# Patient Record
Sex: Female | Born: 1935 | Race: White | Hispanic: No | Smoking: Never smoker
Health system: Southern US, Community
[De-identification: ages and names within clinical notes are randomized; demographics above are authoritative.]

## PROBLEM LIST (undated history)

## (undated) DIAGNOSIS — C439 Malignant melanoma of skin, unspecified: Secondary | ICD-10-CM

## (undated) DIAGNOSIS — Z955 Presence of coronary angioplasty implant and graft: Secondary | ICD-10-CM

## (undated) DIAGNOSIS — C801 Malignant (primary) neoplasm, unspecified: Secondary | ICD-10-CM

## (undated) DIAGNOSIS — I10 Essential (primary) hypertension: Secondary | ICD-10-CM

## (undated) DIAGNOSIS — H269 Unspecified cataract: Secondary | ICD-10-CM

## (undated) DIAGNOSIS — E785 Hyperlipidemia, unspecified: Secondary | ICD-10-CM

## (undated) DIAGNOSIS — I219 Acute myocardial infarction, unspecified: Secondary | ICD-10-CM

## (undated) DIAGNOSIS — N183 Chronic kidney disease, stage 3 unspecified: Secondary | ICD-10-CM

## (undated) DIAGNOSIS — M1990 Unspecified osteoarthritis, unspecified site: Secondary | ICD-10-CM

## (undated) HISTORY — DX: Malignant (primary) neoplasm, unspecified: C80.1

## (undated) HISTORY — PX: APPENDECTOMY: SHX54

## (undated) HISTORY — DX: Unspecified cataract: H26.9

## (undated) HISTORY — PX: PITUITARY SURGERY: SHX203

## (undated) HISTORY — PX: BREAST SURGERY: SHX581

## (undated) HISTORY — DX: Unspecified osteoarthritis, unspecified site: M19.90

## (undated) HISTORY — PX: ABDOMINAL HYSTERECTOMY: SHX81

## (undated) HISTORY — PX: CARDIAC CATHETERIZATION: SHX172

## (undated) HISTORY — DX: Acute myocardial infarction, unspecified: I21.9

## (undated) HISTORY — PX: EYE SURGERY: SHX253

---

## 1995-07-12 DIAGNOSIS — I219 Acute myocardial infarction, unspecified: Secondary | ICD-10-CM

## 1995-07-12 HISTORY — DX: Acute myocardial infarction, unspecified: I21.9

## 1997-10-23 ENCOUNTER — Ambulatory Visit (HOSPITAL_COMMUNITY): Admission: RE | Admit: 1997-10-23 | Discharge: 1997-10-23 | Payer: Self-pay | Admitting: Cardiology

## 1997-12-29 ENCOUNTER — Other Ambulatory Visit: Admission: RE | Admit: 1997-12-29 | Discharge: 1997-12-29 | Payer: Self-pay | Admitting: Internal Medicine

## 1998-10-09 ENCOUNTER — Emergency Department (HOSPITAL_COMMUNITY): Admission: EM | Admit: 1998-10-09 | Discharge: 1998-10-09 | Payer: Self-pay | Admitting: Emergency Medicine

## 1998-10-09 ENCOUNTER — Encounter: Payer: Self-pay | Admitting: Emergency Medicine

## 1999-04-19 ENCOUNTER — Inpatient Hospital Stay (HOSPITAL_COMMUNITY): Admission: RE | Admit: 1999-04-19 | Discharge: 1999-04-22 | Payer: Self-pay | Admitting: *Deleted

## 1999-04-19 ENCOUNTER — Encounter (INDEPENDENT_AMBULATORY_CARE_PROVIDER_SITE_OTHER): Payer: Self-pay | Admitting: *Deleted

## 1999-04-24 ENCOUNTER — Encounter: Payer: Self-pay | Admitting: Obstetrics and Gynecology

## 1999-04-24 ENCOUNTER — Inpatient Hospital Stay (HOSPITAL_COMMUNITY): Admission: AD | Admit: 1999-04-24 | Discharge: 1999-04-27 | Payer: Self-pay | Admitting: Obstetrics and Gynecology

## 1999-12-28 ENCOUNTER — Ambulatory Visit (HOSPITAL_COMMUNITY): Admission: RE | Admit: 1999-12-28 | Discharge: 1999-12-28 | Payer: Self-pay | Admitting: Orthopedic Surgery

## 1999-12-28 ENCOUNTER — Encounter: Payer: Self-pay | Admitting: Orthopedic Surgery

## 2000-05-10 ENCOUNTER — Other Ambulatory Visit: Admission: RE | Admit: 2000-05-10 | Discharge: 2000-05-10 | Payer: Self-pay | Admitting: *Deleted

## 2001-05-16 ENCOUNTER — Other Ambulatory Visit: Admission: RE | Admit: 2001-05-16 | Discharge: 2001-05-16 | Payer: Self-pay | Admitting: *Deleted

## 2001-10-24 ENCOUNTER — Emergency Department (HOSPITAL_COMMUNITY): Admission: EM | Admit: 2001-10-24 | Discharge: 2001-10-24 | Payer: Self-pay | Admitting: Emergency Medicine

## 2001-10-24 ENCOUNTER — Encounter: Payer: Self-pay | Admitting: Emergency Medicine

## 2001-11-02 ENCOUNTER — Encounter: Payer: Self-pay | Admitting: Internal Medicine

## 2001-11-02 ENCOUNTER — Ambulatory Visit (HOSPITAL_COMMUNITY): Admission: RE | Admit: 2001-11-02 | Discharge: 2001-11-02 | Payer: Self-pay | Admitting: Internal Medicine

## 2001-12-31 ENCOUNTER — Encounter: Payer: Self-pay | Admitting: Neurosurgery

## 2002-01-04 ENCOUNTER — Encounter (INDEPENDENT_AMBULATORY_CARE_PROVIDER_SITE_OTHER): Payer: Self-pay | Admitting: *Deleted

## 2002-01-04 ENCOUNTER — Inpatient Hospital Stay (HOSPITAL_COMMUNITY): Admission: RE | Admit: 2002-01-04 | Discharge: 2002-01-08 | Payer: Self-pay | Admitting: Neurosurgery

## 2002-03-12 ENCOUNTER — Encounter (INDEPENDENT_AMBULATORY_CARE_PROVIDER_SITE_OTHER): Payer: Self-pay | Admitting: Specialist

## 2002-03-12 ENCOUNTER — Ambulatory Visit (HOSPITAL_BASED_OUTPATIENT_CLINIC_OR_DEPARTMENT_OTHER): Admission: RE | Admit: 2002-03-12 | Discharge: 2002-03-12 | Payer: Self-pay | Admitting: Plastic Surgery

## 2002-08-19 ENCOUNTER — Ambulatory Visit (HOSPITAL_COMMUNITY): Admission: RE | Admit: 2002-08-19 | Discharge: 2002-08-19 | Payer: Self-pay | Admitting: Neurosurgery

## 2002-08-19 ENCOUNTER — Encounter: Payer: Self-pay | Admitting: Neurosurgery

## 2003-06-01 ENCOUNTER — Encounter: Admission: RE | Admit: 2003-06-01 | Discharge: 2003-06-01 | Payer: Self-pay | Admitting: Orthopedic Surgery

## 2003-06-13 ENCOUNTER — Ambulatory Visit (HOSPITAL_COMMUNITY): Admission: RE | Admit: 2003-06-13 | Discharge: 2003-06-13 | Payer: Self-pay | Admitting: Orthopedic Surgery

## 2004-03-28 ENCOUNTER — Emergency Department (HOSPITAL_COMMUNITY): Admission: EM | Admit: 2004-03-28 | Discharge: 2004-03-28 | Payer: Self-pay | Admitting: Emergency Medicine

## 2008-12-09 ENCOUNTER — Ambulatory Visit: Payer: Self-pay | Admitting: Oncology

## 2009-01-08 ENCOUNTER — Ambulatory Visit: Payer: Self-pay | Admitting: Oncology

## 2009-02-04 ENCOUNTER — Emergency Department (HOSPITAL_COMMUNITY): Admission: EM | Admit: 2009-02-04 | Discharge: 2009-02-04 | Payer: Self-pay | Admitting: Emergency Medicine

## 2010-11-26 NOTE — Discharge Summary (Signed)
   NAME:  Amber Bender, Amber Bender                          ACCOUNT NO.:  [id number]   MEDICAL RECORD NO.:  [id number]                   PATIENT TYPE:  NP   LOCATION:  3001                                 FACILITY:  [facility]   PHYSICIAN:  Kathaleen Maser. Pool, M.D.                 DATE OF BIRTH:  25-Nov-1920   DATE OF ADMISSION:  01/04/2002  DATE OF DISCHARGE:  01/08/2002                                 DISCHARGE SUMMARY   FINAL DIAGNOSIS:  Pituitary macroadenoma with optic chiasm compression.   OPERATIONS AND TREATMENTS:  Transphenoidal resection of pituitary  macroadenoma.   HISTORY OF PRESENT ILLNESS:  The patient is a 77 year old female who has  complaints of headache and is beginning to have some symptoms of visual  field loss.  Workup has demonstrated a large pituitary macroadenoma with  suprasellar extension causing optic chiasmatic compression.  Endocrine  workup has demonstrated that this is a non-functional macroadenoma.  The  patient presents now for elective resection of her tumor.   HOSPITAL COURSE:  The patient was taken to the operating room where an  uncomplicated transphenoidal resection of her macroadenoma was performed.  Postoperatively, the patient did well.  She had no evidence of  endocrinopathy.  Her visual field and acuity were intact bilaterally.  There  is no evidence of CSF leak.  She was gradually mobilized over time.  Dr.  Ezzard Standing removed her nasal packing.  At the time of discharge, she was doing  quite well without any complaints.   CONDITION ON DISCHARGE:  Improved.   DISCHARGE MEDICATIONS:  Prednisone 5 mg p.o. q.a.m. and 2.5 mg p.o. q.p.m.  She is to resume all home medications otherwise.  She is to follow up with  me in one week.  She is to follow up with Dr. Ezzard Standing in one week.  She is to  follow up with her primary medical doctor in two weeks for evaluation of her  pituitary cortisol axis.                                               Henry A. Pool, M.D.    HAP/MEDQ  D:  02/06/2002  T:  02/12/2002  Job:  [id number]

## 2010-11-26 NOTE — Op Note (Signed)
[place]. [facility]  Patient:    Amber Bender, Amber Bender Visit Number: [id number] MRN: [id number]          Service Type: SUR Location: 3100 [id number] Attending Physician:  Donn Pierini Dictated by:   Kristine Garbe Ezzard Standing, M.D. Proc. Date: 01/04/02 Admit Date:  01/04/2002   CC:         Julio Sicks, M.D.   Operative Report  PREOPERATIVE DIAGNOSIS:  Pituitary tumor.  POSTOPERATIVE DIAGNOSIS:  Pituitary tumor.  OPERATION: Transeptal transphenoidal resection of pituitary tumor.  SURGEONS:  Surgeon Kristine Garbe. Ezzard Standing, M.D., co-surgenon Julio Sicks, M.D.  ANESTHESIA: General endotracheal anesthesia.  COMPLICATIONS:  None.  INDICATIONS: The patient is a 77 year old female who has been having headaches. She underwent a CT scan and was found to have a pituitary tumor. She has had some mild vision changes and was taken to the operating room at this time for transphenoidal resection of pituitary tumor.  DESCRIPTION OF THE PROCEDURE:  The patient was brought to the operating room after undergoing general endotracheal anesthesia. The patient was positioned by Dr. Jordan Likes. The nose was then prepped by myself, Dr. Ezzard Standing, with 4% cocaine soaked pledgets in the septum, and the floor of the nose was injected with 1% Xylocaine with 1:100,000 epinephrine. The nose was then prepped with Betadine solution and draped out with sterile towels.  An extended hemitransfixion incision was made along the caudal edge of the septum on the right side. This was extended down to the floor of the nose. The mucoperiosteum was elevated off the floor of the nose and the mucoperichondroma was elevated off the right side of the septum and the septal cartilage and back to the periosteum of the septal bone on the right side.  On the left side the mucoperiosteum was elevated off the floor of the nose. The septum was then transected approximately 2 to 3 cm posteriorly. The anterior  cartilaginous septum was dislocated off of the maxillary crest into the left airway. A piece of cartilage was harvested for later closure.  The mucoperiosteum was then elevated on either side of the posterior septal bone down to the face of the sphenoid sinus. Using the small 4 mm osteotome, the sphenoid sinus was entered. Using rongeurs, the sphenoidotomy was enlarged to expose the whole sphenoid sinus and the sella. The sphenoid septum was taken down back to the face of the sella.  At this time Dr. Jordan Likes was scrubbed in and resected the pituitary tumor. After the resection of the pituitary tumor, I came back in to close the approach. The cartilaginous septum was brought back to midline on the maxillary crest. The mucoperichondrial flaps were brought back to midline. The hemitransfixion incision was closed with interrupted 5-0 chromic sutures and the septum with a 3-0 chromic suture. Silastic sheeting was placed on either side of the septum and secured with a 3-0 nylon suture and splints. The nose was then packed with 1/2 inch nonadherent gauze soaked in Bacitracin ointment and soaked with Bacitracin ointment along the floor of the nose bilaterally.  The throat pack was removed. The patient was awakened from anesthesia and transferred to the recovery room postoperatively doing well.  DISPOSITION:  The patient will be admitted to the neuro intensive care unit and we will plan on removing nasal packing in two to three days and plant subsequent discharge after packing removed.               Dictated by: Kristine Garbe Ezzard Standing, M.D.  Attending Physician:  Donn Pierini DD:  01/04/02 TD:  01/06/02 Job: [id number]

## 2010-11-26 NOTE — Op Note (Signed)
[place]. [facility]  Patient:    Amber Bender, Amber Bender Visit Number: [id number] MRN: [id number]          Service Type: SUR Location: 3100 [id number] Attending Physician:  Donn Pierini Dictated by:   Julio Sicks, M.D. Proc. Date: 01/04/02 Admit Date:  01/04/2002                             Operative Report  PREOPERATIVE DIAGNOSIS:   Pituitary macroadenoma with optic chiasm compression.  POSTOPERATIVE DIAGNOSIS:  Pituitary macroadenoma with optic chiasm compression.  PROCEDURES PERFORMED:  Transphenoidal resection of pituitary macroadenoma.  SURGEON:  Julio Sicks, M.D.  ASSISTANT SURGEONS:  Reinaldo Meeker, M.D., ENT surgeon Kristine Garbe. Ezzard Standing, M.D.  ANESTHESIA: General endotracheal anesthesia.  INDICATIONS:  The patient is a 77 year old female who has a history of some retro-orbital pain and visual field loss. A workup has demonstrated a nonfunctional pituitary macroadenoma with chiasmatic compression. The patient has been counselled as to her options. She has decided to proceed with a transphenoidal resection of her tumor. She is aware of the risks and benefits and wishes to proceed.  OPERATIVE NOTE:  The patient was taken to the operating room and placed in the supine position on the operating table. After an adequate level of anesthesia was achieved, the patient was positioned supine with her head placed in a [place] horseshoe. The patients nose was then prepped and draped sterilely by Dr. Ezzard Standing.  Dr. Ezzard Standing then made an approach into the sphenoid sinus. The sphenoid sinus mucosa was stripped. A microscope was brought into the field. The tumor had eroded through the floor of the sella turcica. I then widened the opening of the sellar floor, exposing the dura overlying the tumor. This was then coagulated using a bipolar electrocautery.  The dura was then incised in an H-incision. The dural leaflets were then coagulated back to the side.  The tumor itself was soft and necrotic with some cystic elements. A micropituitary was used to collect samples and these were sent to pathology. This was consistent with a microadenoma.  Using cautery curettes the sella was then curettaged of all tumor. The diaphragm sella began to herniate downward. There was a good pulsatile compression of the diaphragm. There was no evidence of any further tumor. The normal pituitary was visible posteriorly.  After inspection of the sella one final time, there once again was no evidence of any residual tumor. The wound was irrigated then with antibiotic solution. Tisseel fibrin glue sealant was then placed over the dural opening. Dr. Ezzard Standing then returned, closed the nose in a typical fashion.  There were no complications with the procedure. The patient tolerated the procedure well and she returned to the recovery room postoperatively. Dictated by:   Julio Sicks, M.D. Attending Physician:  Donn Pierini DD:  01/04/02 TD:  01/06/02 Job: [id number]

## 2010-11-26 NOTE — Op Note (Signed)
NAME:  VARONICA, SIHARATH                          ACCOUNT NO.:  [id number]   MEDICAL RECORD NO.:  [id number]                   PATIENT TYPE:  AMB   LOCATION:  DAY                                  FACILITY:  [facility]   PHYSICIAN:  Ronald A. Gioffre, M.D.             DATE OF BIRTH:  [birth year]   DATE OF PROCEDURE:  06/13/2003  DATE OF DISCHARGE:                                 OPERATIVE REPORT   PREOPERATIVE DIAGNOSES:  1. Torn medial and torn lateral menisci, left knee.  2. Degenerative arthritis, left knee.   POSTOPERATIVE DIAGNOSES:  1. Torn medial and torn lateral menisci, left knee.  2. Degenerative arthritis, left knee.   OPERATION:  1. Diagnostic arthroscopy, left knee.  2. Medial meniscectomy, left knee.  3. Lateral meniscectomy, left knee.   SURGEON:  Georges Lynch. Darrelyn Hillock, M.D.   ASSESSMENT:  Nurse.   DESCRIPTION OF PROCEDURE:  Under general anesthesia, routine orthopedic prep  and draping of the left lower extremity was carried out.  She had 1 g of IV  Ancef.  A small punctate incision made in the suprapatellar pouch and the  inflow cannula was inserted, and the knee was distended with saline.  At  this time another small punctate incision made in the anterior lateral  joint, the arthroscope was entered, and a complete diagnostic arthroscopy is  carried out.  I noted that she had medial and lateral meniscal tears.  I  introduced the shaver suction device, did medial and lateral meniscectomy.  The patient's main arthritic problem was over in the medial joint space.  She had some changes laterally as well.  There were no other gross  abnormalities about the knee.  I thoroughly irrigated out the knee, removed  all the fluid, sutured all three wounds with 3-0 nylon suture, and injected  30 mL of 0.5% Marcaine with epinephrine in the knee joint and applied  sterile Neosporin bundle dressing.   Postop she will be on [facility] for pain one every four hours p.r.n.  She will be  on Bufferin one twice a day as an anticoagulant.  She will be on  the walker, partial weightbearing to full weightbearing, and I will see her  in two weeks or prior to that.                                               Ronald A. Darrelyn Hillock, M.D.    RAG/MEDQ  D:  06/13/2003  T:  06/13/2003  Job:  [id number]

## 2010-12-22 ENCOUNTER — Ambulatory Visit: Payer: Self-pay | Admitting: Ophthalmology

## 2011-01-04 ENCOUNTER — Ambulatory Visit: Payer: Self-pay | Admitting: Ophthalmology

## 2011-11-02 ENCOUNTER — Ambulatory Visit (INDEPENDENT_AMBULATORY_CARE_PROVIDER_SITE_OTHER): Payer: MEDICARE | Admitting: Family Medicine

## 2011-11-02 DIAGNOSIS — J301 Allergic rhinitis due to pollen: Secondary | ICD-10-CM

## 2011-11-02 DIAGNOSIS — R05 Cough: Secondary | ICD-10-CM

## 2011-11-02 DIAGNOSIS — R059 Cough, unspecified: Secondary | ICD-10-CM

## 2011-11-02 MED ORDER — BENZONATATE 100 MG PO CAPS: 100.0000 mg | ORAL_CAPSULE | Freq: Three times a day (TID) | ORAL | Status: AC | PRN

## 2011-11-02 MED ORDER — AZELASTINE HCL 0.1 % NA SOLN: 1.0000 | Freq: Two times a day (BID) | NASAL | Status: DC

## 2011-11-02 NOTE — Patient Instructions (Signed)
Try nasal spray and cough medicine as prescribed.  If not improving in next few days, return to clinic or follow up with your primary care provider. Return to the clinic or go to the nearest emergency room if any of your symptoms worsen or new symptoms occur. Allergic Rhinitis Allergic rhinitis is when the mucous membranes in the nose respond to allergens. Allergens are particles in the air that cause your body to have an allergic reaction. This causes you to release allergic antibodies. Through a chain of events, these eventually cause you to release histamine into the blood stream (hence the use of antihistamines). Although meant to be protective to the body, it is this release that causes your discomfort, such as frequent sneezing, congestion and an itchy runny nose.  CAUSES  The pollen allergens may come from grasses, trees, and weeds. This is seasonal allergic rhinitis, or "hay fever." Other allergens cause year-round allergic rhinitis (perennial allergic rhinitis) such as house dust mite allergen, pet dander and mold spores.  SYMPTOMS   Nasal stuffiness (congestion).   Runny, itchy nose with sneezing and tearing of the eyes.   There is often an itching of the mouth, eyes and ears.  It cannot be cured, but it can be controlled with medications. DIAGNOSIS  If you are unable to determine the offending allergen, skin or blood testing may find it. TREATMENT   Avoid the allergen.   Medications and allergy shots (immunotherapy) can help.   Hay fever may often be treated with antihistamines in pill or nasal spray forms. Antihistamines block the effects of histamine. There are over-the-counter medicines that may help with nasal congestion and swelling around the eyes. Check with your caregiver before taking or giving this medicine.  If the treatment above does not work, there are many new medications your caregiver can prescribe. Stronger medications may be used if initial measures are  ineffective. Desensitizing injections can be used if medications and avoidance fails. Desensitization is when a patient is given ongoing shots until the body becomes less sensitive to the allergen. Make sure you follow up with your caregiver if problems continue. SEEK MEDICAL CARE IF:   You develop fever (more than 100.5 F (38.1 C).   You develop a cough that does not stop easily (persistent).   You have shortness of breath.   You start wheezing.   Symptoms interfere with normal daily activities.  Document Released: 03/22/2001 Document Revised: 06/16/2011 Document Reviewed: 10/01/2008 [facility] Patient Information 2012 [place], [place].  Cough, Adult  A cough is a reflex that helps clear your throat and airways. It can help heal the body or may be a reaction to an irritated airway. A cough may only last 2 or 3 weeks (acute) or may last more than 8 weeks (chronic).  CAUSES Acute cough:  Viral or bacterial infections.  Chronic cough:  Infections.   Allergies.   Asthma.   Post-nasal drip.   Smoking.   Heartburn or acid reflux.   Some medicines.   Chronic lung problems (COPD).   Cancer.  SYMPTOMS   Cough.   Fever.   Chest pain.   Increased breathing rate.   High-pitched whistling sound when breathing (wheezing).   Colored mucus that you cough up (sputum).  TREATMENT   A bacterial cough may be treated with antibiotic medicine.   A viral cough must run its course and will not respond to antibiotics.   Your caregiver may recommend other treatments if you have a chronic cough.  HOME  CARE INSTRUCTIONS   Only take over-the-counter or prescription medicines for pain, discomfort, or fever as directed by your caregiver. Use cough suppressants only as directed by your caregiver.   Use a cold steam vaporizer or humidifier in your bedroom or home to help loosen secretions.   Sleep in a semi-upright position if your cough is worse at night.   Rest as needed.    Stop smoking if you smoke.  SEEK IMMEDIATE MEDICAL CARE IF:   You have pus in your sputum.   Your cough starts to worsen.   You cannot control your cough with suppressants and are losing sleep.   You begin coughing up blood.   You have difficulty breathing.   You develop pain which is getting worse or is uncontrolled with medicine.   You have a fever.  MAKE SURE YOU:   Understand these instructions.   Will watch your condition.   Will get help right away if you are not doing well or get worse.  Document Released: 12/24/2010 Document Revised: 06/16/2011 Document Reviewed: 12/24/2010 [facility] Patient Information 2012 [place], [place].

## 2011-11-02 NOTE — Progress Notes (Signed)
  Subjective:    Patient ID: Amber Bender, female    DOB: 01/27/1921, 78 y.o.   MRN: [id number]  Amber Bender is a 78 y.o. female Hx of pituitary tumor s/p removal in 2003 , had sinus bone removal,  Has chronic drainage in sinuses at time.  Clear discharge at times.  More congestion this past week and drainage down throat.  Sore throat, sneezing.  Now with PND - cough.  No shortness of breath, no fever.  Clear nasal discharge.  Using saline NS. Allergy meds in past makes heart race - not sure of name of particular medicine that did this. Has stent., but no known arrhythmia. Tried childrens benadryl x 1 - made tired, but no heart racing.  Cardiologist: [place] - called their office - last eval in 2005, no known hx of arrhythmia. primary provider: Jacky Kindle.   Review of Systems  Constitutional: Negative for fever and chills.  HENT: Positive for congestion, rhinorrhea, sneezing, voice change and postnasal drip. Negative for trouble swallowing.   Respiratory: Positive for cough. Negative for chest tightness and shortness of breath.   Cardiovascular: Negative for chest pain.       Objective:   Physical Exam  Constitutional: She is oriented to person, place, and time. She appears well-developed and well-nourished.  HENT:  Head: Normocephalic.  Right Ear: External ear normal.  Mouth/Throat: Oropharynx is clear and moist.  Neck: Normal range of motion.  Cardiovascular: Normal rate, regular rhythm, normal heart sounds and intact distal pulses.   Pulmonary/Chest: Effort normal and breath sounds normal. No respiratory distress. She has no wheezes.  Abdominal: Soft.  Neurological: She is alert and oriented to person, place, and time.  Skin: Skin is warm and dry.  Psychiatric: She has a normal mood and affect. Her behavior is normal.       Assessment & Plan:  Amber Bender is a 78 y.o. female Cough with PND, suspected allergic rhinitis.  Lungs clear on exam.  Vitals reassuring. Trial  of astelin ns 1-2 spr/nost bid prn, cont slaine nasal spray, and tessalon perles tid prn.  If not improving in few days, or cough worsens sooner, rtc.

## 2012-02-16 ENCOUNTER — Other Ambulatory Visit: Payer: Self-pay | Admitting: Dermatology

## 2012-04-02 ENCOUNTER — Ambulatory Visit: Payer: MEDICARE

## 2012-04-02 ENCOUNTER — Ambulatory Visit (INDEPENDENT_AMBULATORY_CARE_PROVIDER_SITE_OTHER): Payer: MEDICARE | Admitting: Family Medicine

## 2012-04-02 VITALS — BP 134/74 | HR 70 | Temp 98.3°F | Resp 16 | Ht 60.0 in | Wt 147.0 lb

## 2012-04-02 DIAGNOSIS — R0781 Pleurodynia: Secondary | ICD-10-CM

## 2012-04-02 DIAGNOSIS — R079 Chest pain, unspecified: Secondary | ICD-10-CM

## 2012-04-02 DIAGNOSIS — R109 Unspecified abdominal pain: Secondary | ICD-10-CM

## 2012-04-02 DIAGNOSIS — R8281 Pyuria: Secondary | ICD-10-CM

## 2012-04-02 LAB — POCT URINALYSIS DIPSTICK
Bilirubin, UA: NEGATIVE
Blood, UA: NEGATIVE
Glucose, UA: NEGATIVE
Ketones, UA: NEGATIVE
Nitrite, UA: NEGATIVE
Protein, UA: NEGATIVE
Spec Grav, UA: 1.01
Urobilinogen, UA: 0.2
pH, UA: 7

## 2012-04-02 LAB — POCT UA - MICROSCOPIC ONLY
Bacteria, U Microscopic: NEGATIVE
Casts, Ur, LPF, POC: NEGATIVE
Crystals, Ur, HPF, POC: NEGATIVE
Mucus, UA: NEGATIVE
Yeast, UA: NEGATIVE

## 2012-04-02 MED ORDER — NITROFURANTOIN MONOHYD MACRO 100 MG PO CAPS: 100.0000 mg | ORAL_CAPSULE | Freq: Two times a day (BID) | ORAL | Status: DC

## 2012-04-02 MED ORDER — TRAMADOL HCL 50 MG PO TABS: 50.0000 mg | ORAL_TABLET | Freq: Four times a day (QID) | ORAL | Status: DC | PRN

## 2012-04-02 MED ORDER — VALACYCLOVIR HCL 1 G PO TABS: 1000.0000 mg | ORAL_TABLET | Freq: Three times a day (TID) | ORAL | Status: DC

## 2012-04-02 NOTE — Progress Notes (Signed)
  Subjective:    Patient ID: Amber Bender, female    DOB: Dec 19, 1920, 78 y.o.   MRN: [id number]  HPI Amber Bender is a 78 y.o. female Started with R sided rib pain - noticed 2 days ago.  No known injury.  No similar pain in past.  Hurts to take deep breath, but not short of breath. No recent prolonged car travel or air travel, no recent calf pain or swelling. Nonsmoker.  Hx of shingles in face about 3 years ago, and then zostavax 6 months later.   Tx: ibuprofen 1-2 x per day for few days, 1 alleve last night.  Noted soreness with turning only overnight.   No known hx of broken ribs or vertebral fx.  Hx of osteopenia.   No anterior chest pain or palpitations.    Review of Systems  Genitourinary: Positive for dysuria (possible slight burning). Negative for urgency, frequency and hematuria.  Musculoskeletal: Positive for back pain.  Skin: Negative for rash.       Objective:   Physical Exam  Constitutional: She is oriented to person, place, and time. She appears well-developed and well-nourished.  HENT:  Head: Normocephalic and atraumatic.  Cardiovascular: Normal rate, regular rhythm, normal heart sounds and intact distal pulses.   Pulmonary/Chest: Effort normal and breath sounds normal.   She exhibits tenderness.  Abdominal: Soft. Normal appearance. There is no tenderness.  Musculoskeletal: Normal range of motion.  Neurological: She is alert and oriented to person, place, and time.  Skin: Skin is warm and dry. No rash noted.   Results for orders placed in visit on 04/02/12  POCT URINALYSIS DIPSTICK      Component Value Range   Color, UA yellow     Clarity, UA clear     Glucose, UA neg     Bilirubin, UA neg     Ketones, UA neg     Spec Grav, UA 1.010     Blood, UA neg     pH, UA 7.0     Protein, UA neg     Urobilinogen, UA 0.2     Nitrite, UA neg     Leukocytes, UA moderate (2+)    POCT UA - MICROSCOPIC ONLY      Component Value Range   WBC, Ur, HPF, POC 5-10     RBC, urine, microscopic 0-2     Bacteria, U Microscopic neg     Mucus, UA neg     Epithelial cells, urine per micros 0-1     Crystals, Ur, HPF, POC neg     Casts, Ur, LPF, POC neg     Yeast, UA neg       [facility] reading (PRIMARY) by  Dr. Neva Seat: Scoliosis, no apparent rib fx.    Assessment & Plan:  Amber Bender is a 78 y.o. female 1. Flank pain  POCT urinalysis dipstick, POCT UA - Microscopic Only  2. Rib pain on right side  DG Ribs Unilateral W/Chest Right  3. Pyuria  POCT UA - Microscopic Only   Flank/back pain - appears msk in nature but ddx includes zoster with bandlike nature.  Slight pyuria - UTI with early pyelo in ddx, but less likely with no fever and minimal sx's.  Check urine culture.  Start macrobid 100mg  BID #14. Valtrex 1 gram tid #21. Heat or ice to area, tylenol otc.  If needed for more intense pain - ultram 50mg  Q6hprn.  rtc precautions.

## 2012-04-02 NOTE — Patient Instructions (Signed)
Your pain is likely from the muscles in between your ribs, but early shingles is possible, an there is a possible urinary tract infection. Start the antibiotic as discussed, tylenol over the counter, or if needed for more sever pain - tramadol as prescribed.  You can also start the valtrex in case this is early shingles.  Your should receive a call or letter about your lab results within the next week to 10 days.  Return to the clinic or go to the nearest emergency room if any of your symptoms worsen or new symptoms occur.

## 2012-04-04 LAB — URINE CULTURE: Colony Count: 9000

## 2012-09-18 ENCOUNTER — Other Ambulatory Visit: Payer: Self-pay | Admitting: Family Medicine

## 2012-09-18 ENCOUNTER — Ambulatory Visit
Admission: RE | Admit: 2012-09-18 | Discharge: 2012-09-18 | Disposition: A | Discharge status: Home / Self Care | Payer: Medicare Other | Source: Ambulatory Visit | Attending: Family Medicine | Admitting: Family Medicine

## 2012-10-08 ENCOUNTER — Other Ambulatory Visit: Payer: Self-pay | Admitting: Internal Medicine

## 2012-10-08 DIAGNOSIS — R109 Unspecified abdominal pain: Secondary | ICD-10-CM

## 2012-10-08 DIAGNOSIS — R110 Nausea: Secondary | ICD-10-CM

## 2013-01-27 ENCOUNTER — Ambulatory Visit: Payer: 59

## 2013-01-27 ENCOUNTER — Ambulatory Visit (INDEPENDENT_AMBULATORY_CARE_PROVIDER_SITE_OTHER): Payer: 59 | Admitting: Family Medicine

## 2013-01-27 VITALS — BP 112/62 | HR 77 | Temp 98.0°F | Resp 16 | Ht 60.5 in | Wt 152.8 lb

## 2013-01-27 DIAGNOSIS — M25559 Pain in unspecified hip: Secondary | ICD-10-CM

## 2013-01-27 DIAGNOSIS — T148XXA Other injury of unspecified body region, initial encounter: Secondary | ICD-10-CM

## 2013-01-27 DIAGNOSIS — M25552 Pain in left hip: Secondary | ICD-10-CM

## 2013-01-27 NOTE — Progress Notes (Signed)
[facility]:  Office Visit  Chief Complaint:  Chief Complaint  Patient presents with  . hip/groin pain    right side since Thursday    HPI: Amber Bender is a 79 y.o. female who complains of  Left sided groin and hip pain x4 days. NKI , sore with walking. Has osteopenia. She was on fosmax for 10 years. She has arthritis in both knees. She goes to [employer]	[place], [employer], and may have injured it there but not sure. They do stretches and things but it is very low impact.   She deneis any injuries/surgeries. She has had problems with arthritis in her hips before.  Denies numbness, weakness, tingling or bowel or bladder incontinence that is new.   Past Medical History  Diagnosis Date  . Cancer   . Cataract   . Myocardial infarction    Past Surgical History  Procedure Laterality Date  . Appendectomy    . Breast surgery    . Eye surgery    . Abdominal hysterectomy     History   Social History  . Marital Status: Widowed    Spouse Name: N/A    Number of Children: N/A  . Years of Education: N/A   Social History Main Topics  . Smoking status: Never Smoker   . Smokeless tobacco: None  . Alcohol Use: Yes  . Drug Use: No  . Sexually Active: No     Comment: widow   Other Topics Concern  . None   Social History Narrative  . None   Family History  Problem Relation Age of Onset  . Cancer Mother    Allergies  Allergen Reactions  . Erythromycin Other (See Comments)    Stomach cramps  . Niacin And Related Hives  . Ciprofloxacin Rash  . Sulfa Antibiotics Rash   Prior to Admission medications   Medication Sig Start Date End Date Taking? Authorizing Provider  aspirin 81 MG tablet Take 81 mg by mouth daily.   Yes Historical Provider, MD  calcium-vitamin D (OSCAL WITH D) 250-125 MG-UNIT per tablet Take 1 tablet by mouth daily.   Yes Historical Provider, MD  glucosamine-chondroitin 500-400 MG tablet Take 1 tablet by mouth 3 (three) times daily.   Yes  Historical Provider, MD  lisinopril-hydrochlorothiazide (PRINZIDE,ZESTORETIC) 20-12.5 MG per tablet Take 1 tablet by mouth daily.   Yes Historical Provider, MD  simvastatin (ZOCOR) 20 MG tablet Take 20 mg by mouth every evening.   Yes Historical Provider, MD  azelastine (ASTELIN) 137 MCG/SPRAY nasal spray Place 1 spray into the nose 2 (two) times daily. Use in each nostril as directed 11/02/11 11/01/12  Shade Flood, MD  nitrofurantoin, macrocrystal-monohydrate, (MACROBID) 100 MG capsule Take 1 capsule (100 mg total) by mouth 2 (two) times daily. 04/02/12   Shade Flood, MD  traMADol (ULTRAM) 50 MG tablet Take 1 tablet (50 mg total) by mouth every 6 (six) hours as needed for pain. 04/02/12   Shade Flood, MD  valACYclovir (VALTREX) 1000 MG tablet Take 1 tablet (1,000 mg total) by mouth 3 (three) times daily. 04/02/12   Shade Flood, MD     ROS: The patient denies fevers, chills, night sweats, unintentional weight loss, chest pain, palpitations, wheezing, dyspnea on exertion, nausea, vomiting, abdominal pain, dysuria, hematuria, melena, numbness, weakness, or tingling.   All other systems have been reviewed and were otherwise negative with the exception of those mentioned in the HPI and as above.    PHYSICAL  EXAMCeasar Mons Vitals:   01/27/13 1444  BP: 112/62  Pulse: 77  Temp: 98 F (36.7 C)  Resp: 16   Filed Vitals:   01/27/13 1444  Height: 5' 0.5" (1.537 m)  Weight: 152 lb 12.8 oz (69.31 kg)   Body mass index is 29.34 kg/(m^2).  General: Alert, no acute distress HEENT:  Normocephalic, atraumatic, oropharynx patent.  Cardiovascular:  Regular rate and rhythm, no rubs murmurs or gallops.  No Carotid bruits, radial pulse intact. No pedal edema.  Respiratory: Clear to auscultation bilaterally.  No wheezes, rales, or rhonchi.  No cyanosis, no use of accessory musculature GI: No organomegaly, abdomen is soft and non-tender, positive bowel sounds.  No masses. Skin: No  rashes. Neurologic: Facial musculature symmetric. Psychiatric: Patient is appropriate throughout our interaction. Lymphatic: No cervical lymphadenopathy Musculoskeletal: Gait intact. + tenderness on left iliac crest on palpation Full ROm 5/5 strength, she was able to do add/abd, IR/ER of hip without problesm No lumabar spine tenderness, no knee tenderness Sensation intact No saddle anesthesia   LABS: Results for orders placed in visit on 04/02/12  URINE CULTURE      Result Value Range   Colony Count 9,000 COLONIES/ML     Organism ID, Bacteria Insignificant Growth    POCT URINALYSIS DIPSTICK      Result Value Range   Color, UA yellow     Clarity, UA clear     Glucose, UA neg     Bilirubin, UA neg     Ketones, UA neg     Spec Grav, UA 1.010     Blood, UA neg     pH, UA 7.0     Protein, UA neg     Urobilinogen, UA 0.2     Nitrite, UA neg     Leukocytes, UA moderate (2+)    POCT UA - MICROSCOPIC ONLY      Result Value Range   WBC, Ur, HPF, POC 5-10     RBC, urine, microscopic 0-2     Bacteria, U Microscopic neg     Mucus, UA neg     Epithelial cells, urine per micros 0-1     Crystals, Ur, HPF, POC neg     Casts, Ur, LPF, POC neg     Yeast, UA neg       EKG/XRAY:   Primary read interpreted by Dr. Conley Rolls at [facility]. + scoliosis + DJD No fx/dislocation    ASSESSMENT/PLAN: Encounter Diagnoses  Name Primary?  . Hip pain, acute, left Yes  . Sprain and strain    Continue with NSAIDs with food prn, monitor for ulcers and GIB C/w ROM exercises and water aerobics F/u prn    Darvis Croft PHUONG, DO 01/27/2013 3:56 PM

## 2013-11-29 ENCOUNTER — Ambulatory Visit: Payer: 59

## 2013-11-29 ENCOUNTER — Ambulatory Visit (INDEPENDENT_AMBULATORY_CARE_PROVIDER_SITE_OTHER): Payer: 59 | Admitting: Family Medicine

## 2013-11-29 VITALS — BP 132/70 | HR 73 | Temp 98.2°F | Resp 18 | Ht 59.5 in | Wt 151.6 lb

## 2013-11-29 DIAGNOSIS — R059 Cough, unspecified: Secondary | ICD-10-CM

## 2013-11-29 DIAGNOSIS — M419 Scoliosis, unspecified: Secondary | ICD-10-CM

## 2013-11-29 DIAGNOSIS — R05 Cough: Secondary | ICD-10-CM

## 2013-11-29 DIAGNOSIS — J209 Acute bronchitis, unspecified: Secondary | ICD-10-CM

## 2013-11-29 MED ORDER — AMOXICILLIN-POT CLAVULANATE 875-125 MG PO TABS: 1.0000 | ORAL_TABLET | Freq: Two times a day (BID) | ORAL | Status: DC

## 2013-11-29 MED ORDER — PREDNISONE 20 MG PO TABS: ORAL_TABLET | ORAL | Status: DC

## 2013-11-29 NOTE — Patient Instructions (Signed)

## 2013-11-29 NOTE — Progress Notes (Signed)
Is a 80 year old woman who comes in with a three-day history of cough. It began as a sore throat several days ago and then has progressed to a deep throaty cough associated with left ear pain. She's had no fever. She's had no shortness of breath or chest pain.  Nevertheless she's been wheezing and coughing all night which has interfered with her sleep.  Objective: HEENT unremarkable with exception of moderately erythematous posterior pharynx. The TMs are normal. Neck: Supple no adenopathy Chest: Bilateral wheezing and basilar rales. Heart: Regular no murmur Extremities: No edema Neuro: Alert, articulate, stable gait  [facility] reading (PRIMARY) by  Dr. Joseph Art: CXR. Marked thoracic scoliosis, no infiltrates, heavy bronchial markings  Assessment: Acute bronchitis, no respiratory compromise  Plan: Antibiotics Acute bronchitis - Plan: amoxicillin-clavulanate (AUGMENTIN) 875-125 MG per tablet, predniSONE (DELTASONE) 20 MG tablet  Cough - Plan: DG Chest 2 View, amoxicillin-clavulanate (AUGMENTIN) 875-125 MG per tablet, predniSONE (DELTASONE) 20 MG tablet  Thoracic scoliosis  Signed, Robyn Haber, MD

## 2014-02-28 ENCOUNTER — Ambulatory Visit (INDEPENDENT_AMBULATORY_CARE_PROVIDER_SITE_OTHER): Payer: 59

## 2014-02-28 ENCOUNTER — Ambulatory Visit (INDEPENDENT_AMBULATORY_CARE_PROVIDER_SITE_OTHER): Payer: 59 | Admitting: Internal Medicine

## 2014-02-28 VITALS — BP 144/70 | HR 95 | Temp 99.2°F | Resp 18

## 2014-02-28 DIAGNOSIS — M79641 Pain in right hand: Secondary | ICD-10-CM

## 2014-02-28 DIAGNOSIS — S20219A Contusion of unspecified front wall of thorax, initial encounter: Secondary | ICD-10-CM

## 2014-02-28 DIAGNOSIS — M79609 Pain in unspecified limb: Secondary | ICD-10-CM

## 2014-02-28 DIAGNOSIS — R079 Chest pain, unspecified: Secondary | ICD-10-CM

## 2014-02-28 DIAGNOSIS — R000 Tachycardia, unspecified: Secondary | ICD-10-CM

## 2014-02-28 MED ORDER — MELOXICAM 15 MG PO TABS: 15.0000 mg | ORAL_TABLET | Freq: Every day | ORAL | Status: DC
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
## 2014-05-30 ENCOUNTER — Emergency Department (HOSPITAL_COMMUNITY)
Admission: EM | Admit: 2014-05-30 | Discharge: 2014-05-30 | Disposition: A | Discharge status: Home / Self Care | Payer: Medicare Other | Attending: Emergency Medicine | Admitting: Emergency Medicine

## 2014-05-30 ENCOUNTER — Emergency Department (HOSPITAL_COMMUNITY): Payer: Medicare Other

## 2014-05-30 ENCOUNTER — Ambulatory Visit (INDEPENDENT_AMBULATORY_CARE_PROVIDER_SITE_OTHER): Payer: 59 | Admitting: Family Medicine

## 2014-05-30 ENCOUNTER — Encounter (HOSPITAL_COMMUNITY): Payer: Self-pay | Admitting: Emergency Medicine

## 2014-05-30 VITALS — BP 146/90 | HR 68 | Temp 97.3°F | Resp 18

## 2014-05-30 DIAGNOSIS — I252 Old myocardial infarction: Secondary | ICD-10-CM | POA: Insufficient documentation

## 2014-05-30 DIAGNOSIS — Z7982 Long term (current) use of aspirin: Secondary | ICD-10-CM | POA: Insufficient documentation

## 2014-05-30 DIAGNOSIS — R0602 Shortness of breath: Secondary | ICD-10-CM

## 2014-05-30 DIAGNOSIS — R112 Nausea with vomiting, unspecified: Secondary | ICD-10-CM | POA: Diagnosis not present

## 2014-05-30 DIAGNOSIS — R079 Chest pain, unspecified: Secondary | ICD-10-CM

## 2014-05-30 DIAGNOSIS — Z79899 Other long term (current) drug therapy: Secondary | ICD-10-CM | POA: Insufficient documentation

## 2014-05-30 DIAGNOSIS — H81399 Other peripheral vertigo, unspecified ear: Secondary | ICD-10-CM | POA: Insufficient documentation

## 2014-05-30 DIAGNOSIS — R1013 Epigastric pain: Secondary | ICD-10-CM | POA: Diagnosis not present

## 2014-05-30 DIAGNOSIS — K2970 Gastritis, unspecified, without bleeding: Secondary | ICD-10-CM | POA: Diagnosis not present

## 2014-05-30 DIAGNOSIS — R42 Dizziness and giddiness: Secondary | ICD-10-CM

## 2014-05-30 HISTORY — DX: Malignant melanoma of skin, unspecified: C43.9

## 2014-05-30 LAB — CBC WITH DIFFERENTIAL/PLATELET
BASOS ABS: 0.1 10*3/uL (ref 0.0–0.1)
Basophils Relative: 1 % (ref 0–1)
EOS PCT: 1 % (ref 0–5)
Eosinophils Absolute: 0.1 10*3/uL (ref 0.0–0.7)
HCT: 38.9 % (ref 36.0–46.0)
Hemoglobin: 13 g/dL (ref 12.0–15.0)
LYMPHS PCT: 24 % (ref 12–46)
Lymphs Abs: 1.8 10*3/uL (ref 0.7–4.0)
MCH: 29.6 pg (ref 26.0–34.0)
MCHC: 33.4 g/dL (ref 30.0–36.0)
MCV: 88.6 fL (ref 78.0–100.0)
Monocytes Absolute: 0.3 10*3/uL (ref 0.1–1.0)
Monocytes Relative: 4 % (ref 3–12)
NEUTROS ABS: 5.4 10*3/uL (ref 1.7–7.7)
Neutrophils Relative %: 70 % (ref 43–77)
PLATELETS: 239 10*3/uL (ref 150–400)
RBC: 4.39 MIL/uL (ref 3.87–5.11)
RDW: 13.6 % (ref 11.5–15.5)
WBC: 7.6 10*3/uL (ref 4.0–10.5)

## 2014-05-30 LAB — LIPASE, BLOOD: Lipase: 37 U/L (ref 11–59)

## 2014-05-30 LAB — URINALYSIS, ROUTINE W REFLEX MICROSCOPIC
Bilirubin Urine: NEGATIVE
Glucose, UA: NEGATIVE mg/dL
Hgb urine dipstick: NEGATIVE
Ketones, ur: NEGATIVE mg/dL
Leukocytes, UA: NEGATIVE
Nitrite: NEGATIVE
Protein, ur: NEGATIVE mg/dL
Specific Gravity, Urine: 1.01 (ref 1.005–1.030)
Urobilinogen, UA: 0.2 mg/dL (ref 0.0–1.0)
pH: 6.5 (ref 5.0–8.0)

## 2014-05-30 LAB — COMPREHENSIVE METABOLIC PANEL
ALT: 14 U/L (ref 0–35)
AST: 17 U/L (ref 0–37)
Albumin: 3.6 g/dL (ref 3.5–5.2)
Alkaline Phosphatase: 82 U/L (ref 39–117)
Anion gap: 12 (ref 5–15)
BUN: 20 mg/dL (ref 6–23)
CALCIUM: 10 mg/dL (ref 8.4–10.5)
CHLORIDE: 103 meq/L (ref 96–112)
CO2: 25 mEq/L (ref 19–32)
Creatinine, Ser: 1.07 mg/dL (ref 0.50–1.10)
GFR calc Af Amer: 51 mL/min — ABNORMAL LOW (ref >90)
GFR calc non Af Amer: 44 mL/min — ABNORMAL LOW (ref >90)
Glucose, Bld: 105 mg/dL — ABNORMAL HIGH (ref 70–99)
Potassium: 4.2 mEq/L (ref 3.7–5.3)
SODIUM: 140 meq/L (ref 137–147)
Total Bilirubin: 0.4 mg/dL (ref 0.3–1.2)
Total Protein: 6.4 g/dL (ref 6.0–8.3)

## 2014-05-30 LAB — I-STAT TROPONIN, ED: Troponin i, poc: 0 ng/mL (ref 0.00–0.08)

## 2014-05-30 MED ORDER — OMEPRAZOLE 20 MG PO CPDR: 20.0000 mg | DELAYED_RELEASE_CAPSULE | Freq: Every day | ORAL | Status: DC

## 2014-05-30 MED ORDER — GI COCKTAIL ~~LOC~~
30.0000 mL | Freq: Once | ORAL | Status: AC
  Administered 2014-05-30: 30 mL via ORAL
  Filled 2014-05-30: qty 30

## 2014-05-30 MED ORDER — ONDANSETRON 4 MG PO TBDP
4.0000 mg | ORAL_TABLET | Freq: Once | ORAL | Status: AC
  Administered 2014-05-30: 4 mg via ORAL
  Filled 2014-05-30: qty 1

## 2014-05-30 NOTE — ED Notes (Signed)
Pts vital signs updated and iv removed pt getting dressed and awaiting discharge paperwork at bedside.

## 2014-05-30 NOTE — ED Notes (Signed)
Pt remains monitored by blood pressure, pulse ox, and 5 lead.  

## 2014-05-30 NOTE — ED Provider Notes (Signed)
CSN: [id number]     Arrival date & time 05/30/14  1241 History   First MD Initiated Contact with Patient 05/30/14 1310     Chief Complaint  Patient presents with  . Chest Pain     (Consider location/radiation/quality/duration/timing/severity/associated sxs/prior Treatment) HPI Comments: Patient states last night prior to going to bed she developed abdominal discomfort and recurrent burping. She denies any change in food or recent surgeries. She has been taking ibuprofen daily for joint pain and does not take any protective medication for her stomach. This morning when she got out of bed she complained of dizziness which she states her whole body was spinning and it was worse if she attempted to lay down. She did not have any problems walking but it did make her more nauseated. She went to urgent care for further evaluation.  There she was given a nitroglycerin which she states helped her epigastric pain somewhat and she was sent here. Currently she denies any dizziness but is still having some discomfort in her epigastric area. She denies shortness of breath, cough, fever  Patient is a 80 y.o. female presenting with chest pain. The history is provided by the patient.  Chest Pain Pain location:  Epigastric Pain quality: burning and dull   Pain radiates to:  Does not radiate Pain radiates to the back: no   Pain severity:  Moderate Onset quality:  Gradual Duration:  12 hours Timing:  Constant Progression:  Partially resolved Chronicity:  New Context comment:  Started last night before bed.  tried some tums without relief Relieved by:  Nothing Worsened by:  Nothing tried Ineffective treatments:  Antacids Associated symptoms: abdominal pain, anorexia, dizziness, nausea and vomiting   Associated symptoms: no back pain, no cough, no diaphoresis, no fever, no shortness of breath and no weakness   Risk factors: coronary artery disease and hypertension   Risk factors: no immobilization and no  smoking     Past Medical History  Diagnosis Date  . Cancer   . Cataract   . Myocardial infarction   . Melanoma    Past Surgical History  Procedure Laterality Date  . Appendectomy    . Breast surgery    . Eye surgery    . Abdominal hysterectomy    . Cardiac catheterization    . Pituitary surgery      Removal of tumor   Family History  Problem Relation Age of Onset  . Cancer Mother    History  Substance Use Topics  . Smoking status: Never Smoker   . Smokeless tobacco: Not on file  . Alcohol Use: Yes     Comment: occasional   OB History    No data available     Review of Systems  Constitutional: Negative for fever and diaphoresis.  Respiratory: Negative for cough and shortness of breath.   Cardiovascular: Positive for chest pain.  Gastrointestinal: Positive for nausea, vomiting, abdominal pain and anorexia.  Musculoskeletal: Negative for back pain.  Neurological: Positive for dizziness. Negative for weakness.  All other systems reviewed and are negative.     Allergies  Erythromycin; Niacin and related; Ciprofloxacin; and Sulfa antibiotics  Home Medications   Prior to Admission medications   Medication Sig Start Date End Date Taking? Authorizing Provider  aspirin 81 MG tablet Take 81 mg by mouth daily.    Historical Provider, MD  calcium-vitamin D (OSCAL WITH D) 250-125 MG-UNIT per tablet Take 1 tablet by mouth daily.    Historical Provider, MD  lisinopril-hydrochlorothiazide (PRINZIDE,ZESTORETIC) 20-12.5 MG per tablet Take 1 tablet by mouth daily.    Historical Provider, MD  simvastatin (ZOCOR) 20 MG tablet Take 20 mg by mouth every evening.    Historical Provider, MD   BP 124/57 mmHg  Pulse 57  Temp(Src) 98 F (36.7 C) (Oral)  Resp 19  SpO2 99% Physical Exam  Constitutional: She is oriented to person, place, and time. She appears well-developed and well-nourished. No distress.  HENT:  Head: Normocephalic and atraumatic.  Right Ear: Tympanic membrane  normal.  Left Ear: A middle ear effusion is present.  Mouth/Throat: Oropharynx is clear and moist.  Eyes: Conjunctivae and EOM are normal. Pupils are equal, round, and reactive to light.  Neck: Normal range of motion. Neck supple.  Cardiovascular: Normal rate, regular rhythm and intact distal pulses.   No murmur heard. Pulmonary/Chest: Effort normal and breath sounds normal. No respiratory distress. She has no wheezes. She has no rales.  Abdominal: Soft. She exhibits no distension. There is tenderness in the epigastric area. There is no rebound and no guarding.  Musculoskeletal: Normal range of motion. She exhibits no edema or tenderness.  Neurological: She is alert and oriented to person, place, and time.  Skin: Skin is warm and dry. No rash noted. No erythema.  Psychiatric: She has a normal mood and affect. Her behavior is normal.  Nursing note and vitals reviewed.   ED Course  Procedures (including critical care time) Labs Review Labs Reviewed  COMPREHENSIVE METABOLIC PANEL - Abnormal; Notable for the following:    Glucose, Bld 105 (*)    GFR calc non Af Amer 44 (*)    GFR calc Af Amer 51 (*)    All other components within normal limits  CBC WITH DIFFERENTIAL  LIPASE, BLOOD  URINALYSIS, ROUTINE W REFLEX MICROSCOPIC  I-STAT TROPOININ, ED    Imaging Review No results found.   EKG Interpretation   Date/Time:  Friday May 30 2014 12:44:37 EST Ventricular Rate:  60 PR Interval:  152 QRS Duration: 121 QT Interval:  443 QTC Calculation: 443 R Axis:   -3 Text Interpretation:  Sinus rhythm Paired ventricular premature complexes  Nonspecific intraventricular conduction delay Artifact No significant  change since last tracing Confirmed by Maryan Rued  MD, Loree Fee ([id number]) on  05/30/2014 1:23:04 PM      MDM   Final diagnoses:  Epigastric abdominal pain    Patient presents with approximately 12 hours of epigastric discomfort, burping and nausea. This morning she woke up  with dizziness which is most consistent with a peripheral vertigo. The patient was spinning and it was worse with movement. It is now resolved. She denies any medication changes but states she is not taking aspirin because she takes ibuprofen daily for her joint pain. She does have a cardiac history with stent placement approximately 20 years ago. She denies any shortness of breath for this episode. Patient was seen in urgent care and given nitroglycerin which she felt helped her symptoms slightly.  Based on patient's history lower suspicion for cardiac cause at this time. However symptoms of been going on since last night so if it is cardiac in origin feel 1 troponin will be adequate to rule the patient out.  Patient does not have right upper quadrant pain or symptoms concerning for cholecystitis. Could be gastritis as patient is taking ibuprofen daily and does not take a PPI. Also low suspicion for stroke as dizziness seemed to have peripheral features and is now resolved.  EKG has  artifact but is otherwise unchanged from prior. CBC, CMP, lipase, troponin, chest x-ray pending. Patient given GI cocktail and Zofran.  3:11 PM Labs are wnl.  Pt's sx are resolved after PPI and zofran.  Will po challenge.  CXR pending.  If normal pt can be d/ced home with PPI.  Blanchie Dessert, MD 05/30/14 [phone]

## 2014-05-30 NOTE — Progress Notes (Signed)
[facility] [address], [place] [address] [phone]  Date:  05/30/2014   Name:  Amber Bender   DOB:  05-Oct-1920   MRN:  [id number]  PCP:  No PCP Per Patient    Chief Complaint: Hypertension and Chest Pain   History of Present Illness:  Amber Bender is a 80 y.o. very pleasant female patient who presents with the following:  Last night she noted her stomach gurgling.  She awoke this am and noted the room spinning. vertigo is now resolved but she notes that her chest and abdomen still feel uncomfortable She is feeling a bit SOB which is different for her.  She is having some chest pressure with nausea and the stomach gurgling has continued today.  She had a small BM this am and normal yesterday. She takes lisinopril and zocor daily.  History of MI in 1990s; she had a stent. She does see cardioloogy regularly but has not seen them very recently .  No recent stress in Epic.  Took her lisinopril and ASA 325mg  this am when she woke up.    She has not been ill recently as far as she knows.  She was dropped off at clinic today by a friend  There are no active problems to display for this patient.   Past Medical History  Diagnosis Date  . Cancer   . Cataract   . Myocardial infarction     Past Surgical History  Procedure Laterality Date  . Appendectomy    . Breast surgery    . Eye surgery    . Abdominal hysterectomy      History  Substance Use Topics  . Smoking status: Never Smoker   . Smokeless tobacco: Not on file  . Alcohol Use: Yes    Family History  Problem Relation Age of Onset  . Cancer Mother     Allergies  Allergen Reactions  . Erythromycin Other (See Comments)    Stomach cramps  . Niacin And Related Hives  . Ciprofloxacin Rash  . Sulfa Antibiotics Rash    Medication list has been reviewed and updated.  Current Outpatient Prescriptions on File Prior to Visit  Medication Sig Dispense Refill  . aspirin 81 MG tablet Take  81 mg by mouth daily.    Marland Kitchen azelastine (ASTELIN) 137 MCG/SPRAY nasal spray Place 1 spray into the nose 2 (two) times daily. Use in each nostril as directed 30 mL 3  . calcium-vitamin D (OSCAL WITH D) 250-125 MG-UNIT per tablet Take 1 tablet by mouth daily.    Marland Kitchen lisinopril-hydrochlorothiazide (PRINZIDE,ZESTORETIC) 20-12.5 MG per tablet Take 1 tablet by mouth daily.    . meloxicam (MOBIC) 15 MG tablet Take 1 tablet (15 mg total) by mouth daily. 10 tablet 0  . predniSONE (DELTASONE) 20 MG tablet 2 daily with food 10 tablet 1  . simvastatin (ZOCOR) 20 MG tablet Take 20 mg by mouth every evening.    . valACYclovir (VALTREX) 1000 MG tablet Take 1 tablet (1,000 mg total) by mouth 3 (three) times daily. 21 tablet 0   No current facility-administered medications on file prior to visit.    Review of Systems:  As per HPI- otherwise negative.   Physical Examination: Filed Vitals:   05/30/14 1140  BP: 146/90  Pulse: 68  Temp: 97.3 F (36.3 C)  Resp: 18   There were no vitals filed for this visit. There is no weight on file to calculate BMI. Ideal Body  Weight:    GEN: WDWN, NAD, Non-toxic, A & O x 3, elderly lady who does appear younger than her stated age 83: Atraumatic, Normocephalic. Neck supple. No masses, No LAD. Ears and Nose: No external deformity. CV: RRR, No M/G/R. No JVD. No thrill. No extra heart sounds. PULM: CTA B, no wheezes, crackles, rhonchi. No retractions. No resp. distress. No accessory muscle use. ABD: S, NT, ND. No rebound. No HSM. EXTR: No c/c/e NEURO Normal gait.  PSYCH: Normally interactive. Conversant. Not depressed or anxious appearing.  Calm demeanor.   EKG:  SR, no St elevation or depression Started IV in left AC, 20 gauge at 11:55 am.  Placed [place] O2, 2L at 12:00 EMS arrived at 12:06 for transport  Assessment and Plan: Chest pain, unspecified chest pain type - Plan: EKG 12-Lead  SOB (shortness of breath) - Plan: EKG 12-Lead  Dizziness - Plan: EKG  12-Lead  Elderly lady with history of MI/ stent here today with chest discomfort, vertigo, and abd discomfort.  Her VS and EKG are reassuring but she does need further eval.  Transport to the ED via EMS  Signed Lamar Blinks, MD

## 2014-05-30 NOTE — ED Notes (Signed)
Pt arrived by [facility] from [facility]. Stated that through the night she was unable to sleep because of pressure to epigastric area. IV was started at [facility] and EMS administered Nitro x1 that helped with symptoms. Pt also c/o nausea and dizziness with symptoms.

## 2014-12-12 ENCOUNTER — Encounter: Payer: Self-pay | Admitting: Podiatry

## 2014-12-12 ENCOUNTER — Ambulatory Visit (INDEPENDENT_AMBULATORY_CARE_PROVIDER_SITE_OTHER): Payer: Medicare Other | Admitting: Podiatry

## 2014-12-12 VITALS — BP 119/59 | HR 69 | Temp 99.2°F | Resp 14

## 2014-12-12 DIAGNOSIS — L600 Ingrowing nail: Secondary | ICD-10-CM | POA: Diagnosis not present

## 2014-12-12 MED ORDER — CEPHALEXIN 500 MG PO CAPS: 500.0000 mg | ORAL_CAPSULE | Freq: Three times a day (TID) | ORAL | Status: DC

## 2014-12-12 NOTE — Progress Notes (Signed)
   Subjective:    Patient ID: Amber Bender, female    DOB: 18-Oct-1920, 81 y.o.   MRN: [id number]  HPI 81 year old female presents to the office today with complaints of a painful ingrown toenail. She states that over the last 1-2 weeks it has become more red and swollen around the nail borders. She also relates some small amount of purulence around the nail border. She has had no prior treatment. No other complaints at this time.   Review of Systems  Musculoskeletal:       Joint pain  Skin: Positive for color change.       Change in nails       Objective:   Physical Exam AAO x3, NAD DP/PT pulses palpable bilaterally, CRT less than 3 seconds Protective sensation intact with Simms Weinstein monofilament, vibratory sensation intact, Achilles tendon reflex intact There is evidence of incurvation on the medial border of the left hallux toenail with tenderness palpation overlying the area. There is edema and erythema localized to the nail borders, purulence is expressed. There is no ascending cellulitis, flexions, crepitus, malodor. The remaining nails without pathology. No other areas of tenderness to bilateral lower extremities. MMT 5/5, ROM WNL.  No open lesions or pre-ulcerative lesions.  No overlying edema, erythema, increase in warmth to bilateral lower extremities.  No pain with calf compression, swelling, warmth, erythema bilaterally.      Assessment & Plan:  81 year old female left medial hallux ingrown toenail with localized infection -Treatment options discussed including all alternatives, risks, and complications -At this time, recommended partial nail removal without chemical matricectomy to the medial aspect of the left hallux nail due to infection. Risks and complications were discussed with the patient for which they understand and  verbally consent to the procedure. Under sterile conditions a total of 3 mL of a mixture of 2% lidocaine plain and 0.5% Marcaine plain was  infiltrated in a hallux block fashion. Once anesthetized, the skin was prepped in sterile fashion. A tourniquet was then applied. Next the symptomatic border of the hallux nail border was sharply excised making sure to remove the entire offending nail border. Once the nail was  Removed, the area was debrided and the underlying skin was intact. The area was irrigated and hemostasis was obtained.  A dry sterile dressing was applied. After application of the dressing the tourniquet was removed and there is found to be an immediate capillary refill time to the digit. The patient tolerated the procedure well any complications. Post procedure instructions were discussed the patient for which he verbally understood. Follow-up in one week for nail check or sooner if any problems are to arise. Discussed signs/symptoms of worsening infection and directed to call the office immediately should any occur or go directly to the emergency room. In the meantime, encouraged to call the office with any questions, concerns, changes symptoms. -Rx keflex

## 2014-12-12 NOTE — Patient Instructions (Signed)

## 2014-12-17 ENCOUNTER — Ambulatory Visit (INDEPENDENT_AMBULATORY_CARE_PROVIDER_SITE_OTHER): Payer: Medicare Other | Admitting: Podiatry

## 2014-12-17 ENCOUNTER — Encounter: Payer: Self-pay | Admitting: Podiatry

## 2014-12-17 VITALS — BP 127/71 | HR 72 | Resp 15

## 2014-12-17 DIAGNOSIS — M79676 Pain in unspecified toe(s): Secondary | ICD-10-CM

## 2014-12-17 DIAGNOSIS — B351 Tinea unguium: Secondary | ICD-10-CM | POA: Diagnosis not present

## 2014-12-17 DIAGNOSIS — L600 Ingrowing nail: Secondary | ICD-10-CM

## 2014-12-17 NOTE — Progress Notes (Signed)
Patient ID: Amber Bender, female   DOB: 08-04-1920, 81 y.o.   MRN: [id number]  Subjective: 81 year old female presents the office they fall evaluation status post left medial hallux partial nail avulsion secondary to infection. She states that she continues to soak her foot twice a day and Epsom salts followed by antibiotic ointment and a Band-Aid. She denies any pain associated with a procedure site and she denies any drainage or purulence. She states there is a small amount of redness remaining although it is improved. Denies any red streaks. She has assistant remainder of her nails are also elongated, thickened and she is unable to trim them. She denies any redness or drainage along the remainder of the nails. She is continuing her antibiotic's. Denies any systemic complaints as fevers, chills, nausea, vomiting. No other complaints at this time in no acute changes since last appointment.  AAO x3, NAD DP/PT pulses palpable bilaterally, CRT less than 3 seconds Protective sensation intact with Derrel Nip monofilament Status post left medial hallux partial nail avulsion which is healing well for this time. There is a small mild granulation tissue the procedure site. There is no tears palpation over the procedure site. There is a small rim of erythema gently around the procedure site however the erythema does appear to be improved. There is no ascending cellulitis, fluctuance, crepitus, malodor. There is no drainage or purulence.  Remainder the nails are also hypertrophic, dystrophic, discolored, brittle, elongated 9. There is tenderness palpation along the nails. There is no surrounding erythema or drainage from the nail sites. No other  areas of tenderness to bilateral lower extremities. MMT 5/5, ROM WNL.  No open lesions or pre-ulcerative lesions.  No overlying edema, erythema, increase in warmth to bilateral lower extremities.  No pain with calf compression, swelling, warmth, erythema  bilaterally.   Assessment: 81 year old female status post left hallux partial nail avulsion with resolving infection; symptomatic onychomycosis  Plan: -Treatment options discussed including all alternatives, risks, and complications -At this time recommended continue soaking Epson salt soaks twice a day followed by antibiotic ointment and a Band-Aid. Can leave the area uncovered at night. Continue this until the area has completely healed. Finish course of antibiotic's. -Nail sharply debrided 9 without complications the bleeding. -Follow-up in 2 weeks if the area is not completely healed or sooner if any problems are to arise. Otherwise, follow-up in 3 months for routine care. In the meantime I encouraged her to call the office with any questions, concerns, change in symptoms. Monitor for any reoccurrence of infection and directed to call the office of any history occur or go to the emergency room.

## 2014-12-19 ENCOUNTER — Ambulatory Visit (INDEPENDENT_AMBULATORY_CARE_PROVIDER_SITE_OTHER): Payer: Medicare Other | Admitting: Podiatry

## 2014-12-19 ENCOUNTER — Encounter: Payer: Self-pay | Admitting: Podiatry

## 2014-12-19 VITALS — BP 128/61 | HR 69 | Temp 97.2°F | Resp 16

## 2014-12-19 DIAGNOSIS — L600 Ingrowing nail: Secondary | ICD-10-CM

## 2014-12-19 DIAGNOSIS — L309 Dermatitis, unspecified: Secondary | ICD-10-CM

## 2014-12-24 ENCOUNTER — Ambulatory Visit: Payer: Medicare Other | Admitting: Podiatry

## 2014-12-25 NOTE — Progress Notes (Signed)
Patient ID: Amber Bender, female   DOB: [birth year], 81 y.o.   MRN: [id number]  Subjective: 81 year old female presents the office they for concerns of a red bump on the top of her left big toe. She states that this started yesterday and she is concerned mature was not infected. She's been soaking the foot in Epson salts cover with antibiotic ointment and a Band-Aid. She denies any systemic complaints as fevers, chills, nausea, vomiting. She denies any pain to the procedure site she denies any surrounding redness or any red streaks. Denies any drainage or purulence, malodor. No other complaints at this time.  Objective: AAO 3, NAD  Neurovascular status unchanged On the dorsal aspect of the left hallux proximal to the nail border there is a single small pinpoint red annular lesion and there is another one at the very distal aspect of the digit. This appears to be almost as a early dermatitis type reaction. There is no tenderness to palpation along the procedure site and there is no drainage or purulence. There is no surrounding erythema, ascending saline disc, fluctuance, crepitus, drainage/purulence, malodor. No other areas of tenderness to bilateral lower extremities. No other open lesions or pre-ulcer lesions identified bilaterally. No pain with calf compression, swelling, warmth, erythema.  Assessment: 81 year old female status post left hallux partial nail avulsion healing well with possible early dermatitis  Plan: -Treatment options discussed including all alternatives, risks, and complications -Recommended to continue soaking in Epson salt soaks however hold off on antibiotic ointment and a supply dry Band-Aid to the area to see if this helps resolve. It appeared that there was a large amount of antibiotic ointment over the procedure site and this could be resulting in a dermatitis type reaction. -Monitor closely for any clinical signs or symptoms of infection and directed to call the office in  remission any occur go to the ER. -Follow-up in 2 weeks at the symptoms are not resolved or sooner if any problems are to arise or any worsening. The meantime I encouraged her to call the office with any questions, concerns, change in symptoms.

## 2015-01-05 ENCOUNTER — Ambulatory Visit (INDEPENDENT_AMBULATORY_CARE_PROVIDER_SITE_OTHER): Payer: Medicare Other | Admitting: Podiatry

## 2015-01-05 ENCOUNTER — Encounter: Payer: Self-pay | Admitting: Podiatry

## 2015-01-05 VITALS — BP 124/66 | HR 66 | Resp 12

## 2015-01-05 DIAGNOSIS — B351 Tinea unguium: Secondary | ICD-10-CM

## 2015-01-05 DIAGNOSIS — Z9889 Other specified postprocedural states: Secondary | ICD-10-CM

## 2015-01-05 DIAGNOSIS — L600 Ingrowing nail: Secondary | ICD-10-CM | POA: Diagnosis not present

## 2015-01-06 NOTE — Progress Notes (Signed)
Patient ID: Amber Bender, female   DOB: 1920/11/23, 81 y.o.   MRN: [id number]  Subjective: 81 year old female presents the office today for follow-up evaluation status post left hallux partial nail avulsion. She states that overall she is doing much better than she was previously. She denies any pain associated with the procedure site she denies any surrounding redness or any red streaks. She's been soaking her foot in Epson salt soaks twice a day cover with antibiotic ointment and a Band-Aid. She denies any systemic complaints as fevers, chills, nausea, vomiting. No other complaints at this time in no acute changes since last appointment.  Objective: AAO 3, NAD Neurovascular status unchanged Left hallux status post partial nail avulsion which is healing well. His his scalp for the procedure site. There is no tenderness palpation overlying the area and there is no surrounding erythema, ascending cellulitis, flexion, crepitus, drainage/purulence, malodor. The area at the previous possible dermatitis type reaction has resolved. There is no other areas of tenderness to bilateral lower extremities. There is no edema, erythema, increase in warmth. Remaining nails are hypertrophic, dystrophic, brittle, elongated. There is no swelling redness or drainage on nail sites. No other open lesions or pre-ulcerative lesions identified bilaterally. There is no pain with calf compression, swelling, warmth, erythema.  Assessment: 81 year old female status post left hallux partial nail avulsion, healing well  Plan: -Treatment options discussed including all alternatives, risks, and complications -At this time the area appears to be healed. Discussed with the scalpel likely follow up in the near future. She can wash the area with antibiotic soap to help remove the scab as it loosens up however did not peel it or pick at it. -Continue to monitor for any redness. If there is any increase in redness or any other signs or  symptoms of infection noted to call the office in the patient any occur. -Nails sharply debrided 9 without complications/bleeding. -Follow-up as needed. In the meantime, encouraged to call the office with any questions, concerns, change in symptoms.   Celesta Gentile, DPM

## 2015-04-06 ENCOUNTER — Encounter: Payer: Self-pay | Admitting: Podiatry

## 2015-04-06 ENCOUNTER — Ambulatory Visit (INDEPENDENT_AMBULATORY_CARE_PROVIDER_SITE_OTHER): Payer: Medicare Other | Admitting: Podiatry

## 2015-04-06 VITALS — BP 132/65 | HR 77 | Resp 18

## 2015-04-06 DIAGNOSIS — B351 Tinea unguium: Secondary | ICD-10-CM

## 2015-04-06 DIAGNOSIS — M79676 Pain in unspecified toe(s): Secondary | ICD-10-CM

## 2015-04-06 NOTE — Progress Notes (Signed)
Patient ID: Amber Bender, female   DOB: Feb 02, 1921, 81 y.o.   MRN: [id number]  Subjective: 81 year old female presents the office today for concerns of left hallux nail peeling. She denies a painful toenail redness or drainage. She states the nail is loose somewhat along the edge which gets caught and socks. She has a states that her remaining nails are also thick, discolored, elongated and painful for which she is unable to trim. No other complaints at this time in no acute changes otherwise.  Objective: AAO 3, NAD Neurovascular status intact Left hallux nail status post partial nail avulsion. On the medial nail edge the nail is somewhat loose and underlying nail bed and appears to be peeling. After debridement the nails firmly adhered to the nail bed. Remaining nails are hypertrophic, dystrophic, brittle, discolored, elongated. There is tenderness to palpation overlying toenails 1-5 bilaterally. There is no swelling erythema or drainage. No open lesions or pre-ulcerative lesions. No pain with calf compression, swelling, warmth, erythema.  Assessment: 81 year old female symptomatic onychomycosis, left hallux onychodystrophy  Plan: -Treatment options discussed including all alternatives, risks, and complications -Nail sharply debrided 10 without complication/bleeding. -Discussed importance of daily foot inspection -Follow-up in 3 months or sooner if any problems arise. In the meantime, encouraged to call the office with any questions, concerns, change in symptoms.   Celesta Gentile, DPM

## 2015-04-20 ENCOUNTER — Telehealth: Payer: Self-pay | Admitting: Cardiology

## 2015-04-20 NOTE — Telephone Encounter (Signed)
Returned call to patient no answer.Unable to leave a message no voice mail. ?

## 2015-04-20 NOTE — Telephone Encounter (Signed)
Pt called in stating that she was a pt of Dr. Martinique but her Orthopedic doctor wanted her to consult with her cardiologist about taking ibuprofen for her legs. Can you advise her in this matter  Thanks

## 2015-04-21 NOTE — Telephone Encounter (Signed)
Returned call to patient.Advised unable to locate last office visit with Dr.Jordan.Stated she has a stress test every 5 years.Stated she will be seeing PCP next week and she will ask him if she needs to make appointment with Dr.Jordan.Stated she is not having any chest pain or sob.She is having leg pain.Advised she can take occasional Ibuprofen, but do not take on a regular basis.She also stated she has melanoma on her face and will be seeing dermatologist next week to see what he recommends.Advised to see PCP as planned.Advised to call back if she needs appointment with Dr.Jordan.

## 2015-07-10 ENCOUNTER — Ambulatory Visit (INDEPENDENT_AMBULATORY_CARE_PROVIDER_SITE_OTHER): Payer: Medicare Other | Admitting: Podiatry

## 2015-07-10 DIAGNOSIS — M79676 Pain in unspecified toe(s): Secondary | ICD-10-CM

## 2015-07-10 DIAGNOSIS — B351 Tinea unguium: Secondary | ICD-10-CM

## 2015-07-10 NOTE — Progress Notes (Signed)
Patient ID: Copeland A Bowne, female   DOB: 02/19/1921, 81 y.o.   MRN: [id number]  Subjective: 81-year-old female presents the office today for concerns of left hallux nail peeling. She denies a painful toenail redness or drainage. She states the nail is loose somewhat along the edge which gets caught and socks. She has a states that her remaining nails are also thick, discolored, elongated and painful for which she is unable to trim. No other complaints at this time in no acute changes otherwise.  Objective: AAO 3, NAD Neurovascular status intact Left hallux nail status post partial nail avulsion. On the medial nail edge the nail is somewhat loose and underlying nail bed and appears to be peeling. After debridement the nails firmly adhered to the nail bed. Remaining nails are hypertrophic, dystrophic, brittle, discolored, elongated. There is tenderness to palpation overlying toenails 1-5 bilaterally. There is no swelling erythema or drainage. No open lesions or pre-ulcerative lesions. No pain with calf compression, swelling, warmth, erythema.  Assessment: 81-year-old female symptomatic onychomycosis, left hallux onychodystrophy  Plan: -Treatment options discussed including all alternatives, risks, and complications -Nail sharply debrided 10 without complication/bleeding. -Discussed importance of daily foot inspection -Follow-up in 3 months or sooner if any problems arise. In the meantime, encouraged to call the office with any questions, concerns, change in symptoms.   Matthew Wagoner, DPM 

## 2016-04-12 DIAGNOSIS — K219 Gastro-esophageal reflux disease without esophagitis: Secondary | ICD-10-CM | POA: Insufficient documentation

## 2016-04-12 DIAGNOSIS — R059 Cough, unspecified: Secondary | ICD-10-CM | POA: Insufficient documentation

## 2016-04-12 DIAGNOSIS — R05 Cough: Secondary | ICD-10-CM | POA: Insufficient documentation

## 2016-08-16 DIAGNOSIS — R0982 Postnasal drip: Secondary | ICD-10-CM | POA: Insufficient documentation

## 2016-08-16 DIAGNOSIS — J300 Vasomotor rhinitis: Secondary | ICD-10-CM | POA: Insufficient documentation

## 2017-02-13 ENCOUNTER — Ambulatory Visit (INDEPENDENT_AMBULATORY_CARE_PROVIDER_SITE_OTHER): Payer: Medicare Other | Admitting: Family Medicine

## 2017-02-13 ENCOUNTER — Encounter: Payer: Self-pay | Admitting: Family Medicine

## 2017-02-13 VITALS — BP 151/70 | HR 67 | Temp 97.7°F | Resp 18 | Ht 59.5 in | Wt 142.8 lb

## 2017-02-13 DIAGNOSIS — N898 Other specified noninflammatory disorders of vagina: Secondary | ICD-10-CM | POA: Diagnosis not present

## 2017-02-13 MED ORDER — MUPIROCIN 2 % EX OINT: 1.0000 "application " | TOPICAL_OINTMENT | Freq: Three times a day (TID) | CUTANEOUS | 0 refills | Status: DC

## 2017-02-13 NOTE — Progress Notes (Signed)
By signing my name below, I, Mesha Guinyard, attest that this documentation has been prepared under the direction and in the presence of Merri Ray, MD.  Electronically Signed: Verlee Monte, Medical Scribe. 02/13/17. 2:41 PM.  Subjective:    Patient ID: Amber Bender, female    DOB: 1921/03/22, 83 y.o.   MRN: [id number]  HPI  Chief Complaint  Patient presents with  . vaginal issue    states she has a bump/boil on the left side of her labia    HPI Comments: Amber Bender is a 83 y.o. female who presents to Primary Care at [facility] complaining of boil on the left side of her labia for the past 2-3 weeks. It started bleeding last night, and concerned area burns when she uses the bathroom. Pt has been soaking it, and it's been getting smaller. Pt is followed by OB/GYN Dr. Ronita Hipps  and she hasn't had any bumps lumps or nodules recently but 15 years ago pt's OB/GYN suspects she had a genital herpes bump. She took valtrex for relief of her sxs.   Pt tries to do water aerobics 3x a week.  HTN: Pt is going to see her PCP [name], MD tomorrow. She notes high blood pressure and plans on following-up with him. BP Readings from Last 3 Encounters:  02/13/17 (!) 151/70  04/06/15 132/65  01/05/15 124/66    There are no active problems to display for this patient.  Past Medical History:  Diagnosis Date  . Allergy   . Arthritis   . Cancer ([place])   . Cataract   . Chronic kidney disease   . Melanoma ([place])   . Myocardial infarction [facility])    Past Surgical History:  Procedure Laterality Date  . ABDOMINAL HYSTERECTOMY    . APPENDECTOMY    . BREAST SURGERY    . CARDIAC CATHETERIZATION    . EYE SURGERY    . PITUITARY SURGERY     Removal of tumor   Allergies  Allergen Reactions  . Erythromycin Other (See Comments)    Stomach cramps  . Niacin And Related Hives  . Septra [Sulfamethoxazole-Trimethoprim]   . Ciprofloxacin Rash  . Sulfa Antibiotics Rash   Prior to Admission  medications   Medication Sig Start Date End Date Taking? Authorizing Provider  Acetaminophen (TYLENOL ARTHRITIS PAIN PO) Take by mouth.   Yes [provider]  lisinopril-hydrochlorothiazide (PRINZIDE,ZESTORETIC) 20-12.5 MG per tablet Take 1 tablet by mouth daily.   Yes [provider]  Multiple Vitamins-Minerals (PRESERVISION AREDS 2) CAPS Take 1 capsule by mouth 2 (two) times daily.   Yes [provider]  omeprazole (PRILOSEC) 20 MG capsule Take 1 capsule (20 mg total) by mouth daily. 05/30/14  Yes Plunkett, Loree Fee, MD  simvastatin (ZOCOR) 20 MG tablet Take 20 mg by mouth every evening.   Yes [provider]  aspirin 81 MG tablet Take 81 mg by mouth at bedtime.     [provider]  calcium-vitamin D (OSCAL WITH D) 250-125 MG-UNIT per tablet Take 1 tablet by mouth daily.    [provider]   Social History   Social History  . Marital status: Widowed    Spouse name: N/A  . Number of children: N/A  . Years of education: N/A   Occupational History  . Not on file.   Social History Main Topics  . Smoking status: Never Smoker  . Smokeless tobacco: Never Used  . Alcohol use 0.0 oz/week     Comment:  occasional  . Drug use: No  . Sexual activity: No     Comment: widow   Other Topics Concern  . Not on file   Social History Narrative  . No narrative on file   Review of Systems  Skin: Positive for wound.   Objective:  Physical Exam  Constitutional: She appears well-developed and well-nourished. No distress.  HENT:  Head: Normocephalic and atraumatic.  Eyes: Conjunctivae are normal.  Neck: Neck supple.  Cardiovascular: Normal rate.   Pulmonary/Chest: Effort normal.  Genitourinary: No vaginal discharge found.  Genitourinary Comments: Left labia majora there are 2 small raw appearing areas with small possible papules Minimal induration No active bleeding/discharge No vesicles  Neurological: She is alert.  Skin: Skin is warm  and dry.  Psychiatric: She has a normal mood and affect. Her behavior is normal.  Nursing note and vitals reviewed.   Vitals:   02/13/17 1424  BP: (!) 151/70  Pulse: 67  Resp: 18  Temp: 97.7 F (36.5 C)  TempSrc: Oral  SpO2: 96%  Weight: 142 lb 12.8 oz (64.8 kg)  Height: 4' 11.5" (1.511 m)   Body mass index is 28.36 kg/m. Assessment & Plan:   Amber Bender is a 83 y.o. female Vaginal irritation - Plan: mupirocin ointment (BACTROBAN) 2 %, Herpes simplex virus culture  - Appears to be slightly abraded/irritated area with likely initial vaginal atrophy secondary to estrogen deficiency. Do not see any vesicular lesions, but reportedly she was told she had possible herpetic lesions in the past.  -HSV culture ordered  -No apparent abscess at present, but start Bactroban topical for possible early infection. Warm soaks, RTC precautions.  -For dry mucosa/atrophy, can try over-the-counter Vagisil or can discuss topical estrogen with her primary care provider at upcoming appointment.  Meds ordered this encounter  Medications  . Acetaminophen (TYLENOL ARTHRITIS PAIN PO)    Sig: Take by mouth.  . mupirocin ointment (BACTROBAN) 2 %    Sig: Apply 1 application topically 3 (three) times daily. For 1 week.    Dispense:  22 g    Refill:  0   Patient Instructions   Try the Bactroban ointment to raw area up to 3 times per day for the next 1 week. Warm soaks twice per day also ok. I will check test for genital herpes, but less likely and if that were the cause, it would be too late to use Valtrex at this point. If bumps increase in size or worsening pain, please return for recheck.   For vaginal dryness, can use over the counter Vagisil moisturizer if needed.  If dryness/irritation of area persists, can discuss other prescription options with your OBGYN or primary care provider at upcoming appointment.     IF you received an x-ray today, you will receive an invoice from [facility].  Please contact [facility] at [phone] with questions or concerns regarding your invoice.   IF you received labwork today, you will receive an invoice from [facility]. Please contact [facility] at [phone] with questions or concerns regarding your invoice.   Our billing staff will not be able to assist you with questions regarding bills from these companies.  You will be contacted with the lab results as soon as they are available. The fastest way to get your results is to activate your My Chart account. Instructions are located on the last page of this paperwork. If you have not heard from [place] regarding the results in 2 weeks, please contact this office.  I personally performed the services described in this documentation, which was scribed in my presence. The recorded information has been reviewed and considered for accuracy and completeness, addended by me as needed, and agree with information above.  Signed,   Merri Ray, MD Primary Care at [place].  02/15/17 12:26 PM

## 2017-02-13 NOTE — Patient Instructions (Addendum)
Try the Bactroban ointment to raw area up to 3 times per day for the next 1 week. Warm soaks twice per day also ok. I will check test for genital herpes, but less likely and if that were the cause, it would be too late to use Valtrex at this point. If bumps increase in size or worsening pain, please return for recheck.   For vaginal dryness, can use over the counter Vagisil moisturizer if needed.  If dryness/irritation of area persists, can discuss other prescription options with your OBGYN or primary care provider at upcoming appointment.     IF you received an x-ray today, you will receive an invoice from [facility]. Please contact [facility] at [phone] with questions or concerns regarding your invoice.   IF you received labwork today, you will receive an invoice from [place]. Please contact [facility] at [phone] with questions or concerns regarding your invoice.   Our billing staff will not be able to assist you with questions regarding bills from these companies.  You will be contacted with the lab results as soon as they are available. The fastest way to get your results is to activate your My Chart account. Instructions are located on the last page of this paperwork. If you have not heard from [place] regarding the results in 2 weeks, please contact this office.

## 2017-02-15 LAB — HERPES SIMPLEX VIRUS CULTURE

## 2017-02-27 ENCOUNTER — Telehealth: Payer: Self-pay | Admitting: Family Medicine

## 2017-02-27 NOTE — Telephone Encounter (Signed)
Pt is calling to get her lab results.  Please advise

## 2017-03-01 NOTE — Telephone Encounter (Signed)
Lm to call back

## 2017-03-02 NOTE — Telephone Encounter (Signed)
Pt returning phone call for labs.  Please adv

## 2017-03-03 NOTE — Telephone Encounter (Signed)
Left message to return call for lab results.

## 2017-04-28 ENCOUNTER — Encounter (HOSPITAL_COMMUNITY): Payer: Self-pay

## 2017-04-28 ENCOUNTER — Emergency Department (HOSPITAL_COMMUNITY)
Admission: EM | Admit: 2017-04-28 | Discharge: 2017-04-28 | Disposition: A | Discharge status: Home / Self Care | Payer: Medicare Other | Attending: Emergency Medicine | Admitting: Emergency Medicine

## 2017-04-28 ENCOUNTER — Emergency Department (HOSPITAL_COMMUNITY): Payer: Medicare Other

## 2017-04-28 DIAGNOSIS — M545 Low back pain: Secondary | ICD-10-CM | POA: Insufficient documentation

## 2017-04-28 DIAGNOSIS — R1012 Left upper quadrant pain: Secondary | ICD-10-CM | POA: Insufficient documentation

## 2017-04-28 DIAGNOSIS — M7918 Myalgia, other site: Secondary | ICD-10-CM | POA: Insufficient documentation

## 2017-04-28 DIAGNOSIS — R079 Chest pain, unspecified: Secondary | ICD-10-CM | POA: Diagnosis not present

## 2017-04-28 DIAGNOSIS — Z79899 Other long term (current) drug therapy: Secondary | ICD-10-CM | POA: Diagnosis not present

## 2017-04-28 DIAGNOSIS — N189 Chronic kidney disease, unspecified: Secondary | ICD-10-CM | POA: Insufficient documentation

## 2017-04-28 DIAGNOSIS — Z7982 Long term (current) use of aspirin: Secondary | ICD-10-CM | POA: Insufficient documentation

## 2017-04-28 DIAGNOSIS — R109 Unspecified abdominal pain: Secondary | ICD-10-CM

## 2017-04-28 DIAGNOSIS — I252 Old myocardial infarction: Secondary | ICD-10-CM | POA: Diagnosis not present

## 2017-04-28 LAB — CBC WITH DIFFERENTIAL/PLATELET
Basophils Absolute: 0 10*3/uL (ref 0.0–0.1)
Basophils Relative: 0 %
EOS ABS: 0.3 10*3/uL (ref 0.0–0.7)
Eosinophils Relative: 3 %
HCT: 36.2 % (ref 36.0–46.0)
Hemoglobin: 12.3 g/dL (ref 12.0–15.0)
LYMPHS ABS: 3.5 10*3/uL (ref 0.7–4.0)
Lymphocytes Relative: 33 %
MCH: 30.2 pg (ref 26.0–34.0)
MCHC: 34 g/dL (ref 30.0–36.0)
MCV: 88.9 fL (ref 78.0–100.0)
Monocytes Absolute: 0.7 10*3/uL (ref 0.1–1.0)
Monocytes Relative: 6 %
Neutro Abs: 6 10*3/uL (ref 1.7–7.7)
Neutrophils Relative %: 58 %
Platelets: 266 10*3/uL (ref 150–400)
RBC: 4.07 MIL/uL (ref 3.87–5.11)
RDW: 13.6 % (ref 11.5–15.5)
WBC: 10.5 10*3/uL (ref 4.0–10.5)

## 2017-04-28 LAB — COMPREHENSIVE METABOLIC PANEL
ALBUMIN: 3.9 g/dL (ref 3.5–5.0)
ALT: 15 U/L (ref 14–54)
AST: 20 U/L (ref 15–41)
Alkaline Phosphatase: 90 U/L (ref 38–126)
Anion gap: 10 (ref 5–15)
BUN: 24 mg/dL — AB (ref 6–20)
CALCIUM: 9.5 mg/dL (ref 8.9–10.3)
CO2: 26 mmol/L (ref 22–32)
CREATININE: 1.24 mg/dL — AB (ref 0.44–1.00)
Chloride: 98 mmol/L — ABNORMAL LOW (ref 101–111)
GFR calc Af Amer: 42 mL/min — ABNORMAL LOW (ref >60)
GFR calc non Af Amer: 36 mL/min — ABNORMAL LOW (ref >60)
GLUCOSE: 106 mg/dL — AB (ref 65–99)
Potassium: 3.7 mmol/L (ref 3.5–5.1)
Sodium: 134 mmol/L — ABNORMAL LOW (ref 135–145)
Total Bilirubin: 0.5 mg/dL (ref 0.3–1.2)
Total Protein: 6.7 g/dL (ref 6.5–8.1)

## 2017-04-28 LAB — URINALYSIS, ROUTINE W REFLEX MICROSCOPIC
BILIRUBIN URINE: NEGATIVE
GLUCOSE, UA: NEGATIVE mg/dL
HGB URINE DIPSTICK: NEGATIVE
KETONES UR: NEGATIVE mg/dL
Leukocytes, UA: NEGATIVE
NITRITE: NEGATIVE
PH: 7 (ref 5.0–8.0)
Protein, ur: NEGATIVE mg/dL
Specific Gravity, Urine: 1.005 (ref 1.005–1.030)

## 2017-04-28 MED ORDER — IOPAMIDOL (ISOVUE-300) INJECTION 61%
100.0000 mL | Freq: Once | INTRAVENOUS | Status: AC | PRN
  Administered 2017-04-28: 100 mL via INTRAVENOUS

## 2017-04-28 MED ORDER — TRAMADOL HCL 50 MG PO TABS
50.0000 mg | ORAL_TABLET | Freq: Once | ORAL | Status: AC
  Administered 2017-04-28: 50 mg via ORAL
  Filled 2017-04-28: qty 1

## 2017-04-28 MED ORDER — TRAMADOL HCL 50 MG PO TABS: 50.0000 mg | ORAL_TABLET | Freq: Four times a day (QID) | ORAL | 0 refills | Status: DC | PRN

## 2017-04-28 MED ORDER — IOPAMIDOL (ISOVUE-300) INJECTION 61%
INTRAVENOUS | Status: AC
  Administered 2017-04-28: 100 mL via INTRAVENOUS
  Filled 2017-04-28: qty 100

## 2017-04-28 NOTE — ED Triage Notes (Signed)
Patient BIB EMS from home with complaints of lower back pain radiating to left ribs and abdomen. Patient states she had similar symptoms on the right side about a week ago. Patient states she has been constipated for 2 days. No symptoms of nausea or vomiting.

## 2017-04-28 NOTE — ED Notes (Signed)
Bed: WA16 Expected date:  Expected time:  Means of arrival:  Comments: EMS 

## 2017-04-28 NOTE — ED Notes (Signed)
This Probation officer assist PT to restroom. PT able to void this writer did not collect urine sample

## 2017-04-28 NOTE — ED Notes (Signed)
Family at bedside. 

## 2017-04-28 NOTE — ED Provider Notes (Signed)
[facility] DEPT Provider Note   CSN: [id number] Arrival date & time: 04/28/17  0031     History   Chief Complaint Chief Complaint  Patient presents with  . Back Pain  . Abdominal Pain    HPI Amber Bender is a 83 y.o. female.  Patient with a history of CKD, MI, arthritis, cancer presents with complaint of left lower chest/upper abdominal pain for 1 day. No nausea or vomiting. No cough or fever. She states she had similar pain on the right side one week ago that resolved without intervention. She denies diarrhea and reports hard, small volume stools over the last several days. There is some low back pain for the same duration of left sided symptoms but she denies dysuria or malodorous urine.   The history is provided by the patient. No language interpreter was used.    Past Medical History:  Diagnosis Date  . Allergy   . Arthritis   . Cancer ([place])   . Cataract   . Chronic kidney disease   . Melanoma ([place])   . Myocardial infarction ([place])     There are no active problems to display for this patient.   Past Surgical History:  Procedure Laterality Date  . ABDOMINAL HYSTERECTOMY    . APPENDECTOMY    . BREAST SURGERY    . CARDIAC CATHETERIZATION    . EYE SURGERY    . PITUITARY SURGERY     Removal of tumor    OB History    No data available       Home Medications    Prior to Admission medications   Medication Sig Start Date End Date Taking? Authorizing Provider  Acetaminophen (TYLENOL ARTHRITIS PAIN PO) Take 1 tablet by mouth daily as needed (pain).    Yes [provider]  aspirin 81 MG tablet Take 81 mg by mouth at bedtime.    Yes [provider]  calcium-vitamin D (OSCAL WITH D) 250-125 MG-UNIT per tablet Take 1 tablet by mouth daily.   Yes [provider]  lisinopril-hydrochlorothiazide (PRINZIDE,ZESTORETIC) 20-12.5 MG per tablet Take 1 tablet by mouth daily.   Yes [provider]    Multiple Vitamins-Minerals (PRESERVISION AREDS 2) CAPS Take 1 capsule by mouth 2 (two) times daily.   Yes [provider]  omeprazole (PRILOSEC) 20 MG capsule Take 1 capsule (20 mg total) by mouth daily. 05/30/14  Yes Plunkett, Loree Fee, MD  simvastatin (ZOCOR) 20 MG tablet Take 20 mg by mouth every evening.   Yes [provider]    Family History Family History  Problem Relation Age of Onset  . Cancer Mother     Social History Social History  Substance Use Topics  . Smoking status: Never Smoker  . Smokeless tobacco: Never Used  . Alcohol use 0.0 oz/week     Comment: occasional     Allergies   Erythromycin; Niacin and related; Septra [sulfamethoxazole-trimethoprim]; Ciprofloxacin; and Sulfa antibiotics   Review of Systems Review of Systems  Constitutional: Negative for chills and fever.  HENT: Negative.   Respiratory: Negative.  Negative for cough and shortness of breath.   Cardiovascular: Positive for chest pain (See HPI.).  Gastrointestinal: Positive for abdominal pain (See HPI.) and constipation. Negative for blood in stool, nausea and vomiting.  Genitourinary: Negative for dysuria, flank pain and frequency.  Musculoskeletal: Positive for back pain.  Skin: Negative.   Neurological: Negative.      Physical Exam Updated Vital Signs BP (!) 159/63 (BP  Location: Left Arm)   Pulse (!) 56   Temp 97.8 F (36.6 C) (Oral)   Resp 18   Ht 5' (1.524 m)   Wt 67.1 kg (148 lb)   SpO2 100%   BMI 28.90 kg/m   Physical Exam  Constitutional: She is oriented to person, place, and time. She appears well-developed and well-nourished.  HENT:  Head: Normocephalic.  Neck: Normal range of motion. Neck supple.  Cardiovascular: Normal rate and regular rhythm.   Pulmonary/Chest: Effort normal and breath sounds normal. She has no wheezes. She has no rales. She exhibits tenderness.    Abdominal: Soft. Bowel sounds are normal. There is tenderness (LUQ). There is no  rebound and no guarding.  Musculoskeletal: Normal range of motion. She exhibits no edema.  Neurological: She is alert and oriented to person, place, and time.  Skin: Skin is warm and dry. No rash noted.  Psychiatric: She has a normal mood and affect.     ED Treatments / Results  Labs (all labs ordered are listed, but only abnormal results are displayed) Labs Reviewed  COMPREHENSIVE METABOLIC PANEL - Abnormal; Notable for the following:       Result Value   Sodium 134 (*)    Chloride 98 (*)    Glucose, Bld 106 (*)    BUN 24 (*)    Creatinine, Ser 1.24 (*)    GFR calc non Af Amer 36 (*)    GFR calc Af Amer 42 (*)    All other components within normal limits  URINE CULTURE  CBC WITH DIFFERENTIAL/PLATELET  URINALYSIS, ROUTINE W REFLEX MICROSCOPIC   Results for orders placed or performed during the hospital encounter of 04/28/17  Urine culture  Result Value Ref Range   Specimen Description URINE, CLEAN CATCH    Special Requests NONE    Culture (A)     <10,000 COLONIES/mL INSIGNIFICANT GROWTH Performed at [facility], [place] [address], [place], [place] [address]    Report Status 04/29/2017 FINAL   CBC with Differential  Result Value Ref Range   WBC 10.5 4.0 - 10.5 K/uL   RBC 4.07 3.87 - 5.11 MIL/uL   Hemoglobin 12.3 12.0 - 15.0 g/dL   HCT 36.2 36.0 - 46.0 %   MCV 88.9 78.0 - 100.0 fL   MCH 30.2 26.0 - 34.0 pg   MCHC 34.0 30.0 - 36.0 g/dL   RDW 13.6 11.5 - 15.5 %   Platelets 266 150 - 400 K/uL   Neutrophils Relative % 58 %   Neutro Abs 6.0 1.7 - 7.7 K/uL   Lymphocytes Relative 33 %   Lymphs Abs 3.5 0.7 - 4.0 K/uL   Monocytes Relative 6 %   Monocytes Absolute 0.7 0.1 - 1.0 K/uL   Eosinophils Relative 3 %   Eosinophils Absolute 0.3 0.0 - 0.7 K/uL   Basophils Relative 0 %   Basophils Absolute 0.0 0.0 - 0.1 K/uL  Comprehensive metabolic panel  Result Value Ref Range   Sodium 134 (L) 135 - 145 mmol/L   Potassium 3.7 3.5 - 5.1 mmol/L   Chloride 98 (L) 101 - 111  mmol/L   CO2 26 22 - 32 mmol/L   Glucose, Bld 106 (H) 65 - 99 mg/dL   BUN 24 (H) 6 - 20 mg/dL   Creatinine, Ser 1.24 (H) 0.44 - 1.00 mg/dL   Calcium 9.5 8.9 - 10.3 mg/dL   Total Protein 6.7 6.5 - 8.1 g/dL   Albumin 3.9 3.5 - 5.0 g/dL  AST 20 15 - 41 U/L   ALT 15 14 - 54 U/L   Alkaline Phosphatase 90 38 - 126 U/L   Total Bilirubin 0.5 0.3 - 1.2 mg/dL   GFR calc non Af Amer 36 (L) >60 mL/min   GFR calc Af Amer 42 (L) >60 mL/min   Anion gap 10 5 - 15  Urinalysis, Routine w reflex microscopic  Result Value Ref Range   Color, Urine COLORLESS (A) YELLOW   APPearance CLEAR CLEAR   Specific Gravity, Urine 1.005 1.005 - 1.030   pH 7.0 5.0 - 8.0   Glucose, UA NEGATIVE NEGATIVE mg/dL   Hgb urine dipstick NEGATIVE NEGATIVE   Bilirubin Urine NEGATIVE NEGATIVE   Ketones, ur NEGATIVE NEGATIVE mg/dL   Protein, ur NEGATIVE NEGATIVE mg/dL   Nitrite NEGATIVE NEGATIVE   Leukocytes, UA NEGATIVE NEGATIVE   EKG  EKG Interpretation None       Radiology Ct Abdomen Pelvis W Contrast  Result Date: 04/28/2017 CLINICAL DATA:  83 y/o F; lower back pain radiating to the left ribs and abdomen with 2 days of constipation. EXAM: CT ABDOMEN AND PELVIS WITH CONTRAST TECHNIQUE: Multidetector CT imaging of the abdomen and pelvis was performed using the standard protocol following bolus administration of intravenous contrast. CONTRAST:  75 cc Isovue-300 COMPARISON:  None. FINDINGS: Lower chest: Small hiatal hernia. Hepatobiliary: Few subcentimeter lucencies in the liver likely represent cysts. No other focal liver lesion identified. Normal gallbladder. No intra or extrahepatic biliary ductal dilatation. Pancreas: Unremarkable. No pancreatic ductal dilatation or surrounding inflammatory changes. Spleen: Normal in size without focal abnormality. Adrenals/Urinary Tract: Adrenal glands are unremarkable. Kidneys are normal, without renal calculi, focal lesion, or hydronephrosis. Bladder is unremarkable. Stomach/Bowel:  Stomach is within normal limits. Extensive sigmoid diverticulosis. No evidence of bowel wall thickening, distention, or inflammatory changes. Vascular/Lymphatic: Aortic atherosclerosis. No enlarged abdominal or pelvic lymph nodes. Reproductive: Status post hysterectomy. No adnexal masses. Other: No abdominal wall hernia or abnormality. No abdominopelvic ascites. Musculoskeletal: Moderate S-shaped curvature of the lumbar spine. Advanced lumbar spondylosis with multilevel discogenic degenerative changes and lower lumbar facet arthrosis. IMPRESSION: 1. No acute process identified. 2. Small hiatal hernia. 3. Extensive sigmoid diverticulosis. No findings of acute diverticulitis. 4. Aortic atherosclerosis. 5. Advanced lumbar spine degenerative changes and S-shaped curvature. Electronically Signed   By: Kristine Garbe M.D.   On: 04/28/2017 06:26    Procedures Procedures (including critical care time)  Medications Ordered in ED Medications  iopamidol (ISOVUE-300) 61 % injection 100 mL (100 mLs Intravenous Contrast Given 04/28/17 0605)     Initial Impression / Assessment and Plan / ED Course  I have reviewed the triage vital signs and the nursing notes.  Pertinent labs & imaging results that were available during my care of the patient were reviewed by me and considered in my medical decision making (see chart for details).     Patient with left lower chest/upper abdominal pain x 1 day. Similar symptoms on right side one week ago. No fever, vomiting, dysuria, cough.   Labs are reassuring. The patient appears comfortable and has declined need for pain medication. CT scan performed and is negative for acute findings. Feel symptoms are musculoskeletal vs constipation. Tramadol Rx'd for home. REturn precautions discussed.   Final Clinical Impressions(s) / ED Diagnoses   Final diagnoses:  None   Side pain Musculoskeletal pain  New Prescriptions New Prescriptions   No medications on file       Charlann Lange, Hershal Coria 05/02/17 2109  Shanon Rosser, MD 05/03/17 2242

## 2017-04-28 NOTE — ED Notes (Signed)
Son is Dalia Jollie contact at [phone]

## 2017-04-28 NOTE — ED Notes (Signed)
Provider at bedside

## 2017-04-28 NOTE — ED Notes (Signed)
Patients son notified of discharge, will be here in 10-15 min to pick patient up.

## 2017-04-29 LAB — URINE CULTURE: Culture: <10000 — AB

## 2017-10-02 ENCOUNTER — Encounter (HOSPITAL_COMMUNITY): Payer: Self-pay | Admitting: Emergency Medicine

## 2017-10-02 ENCOUNTER — Emergency Department (HOSPITAL_COMMUNITY): Payer: Medicare Other

## 2017-10-02 ENCOUNTER — Other Ambulatory Visit: Payer: Self-pay

## 2017-10-02 ENCOUNTER — Observation Stay (HOSPITAL_COMMUNITY)
Admission: EM | Admit: 2017-10-02 | Discharge: 2017-10-03 | Disposition: A | Discharge status: Home / Self Care | Payer: Medicare Other | Attending: Internal Medicine | Admitting: Internal Medicine

## 2017-10-02 DIAGNOSIS — N183 Chronic kidney disease, stage 3 unspecified: Secondary | ICD-10-CM | POA: Diagnosis present

## 2017-10-02 DIAGNOSIS — Z881 Allergy status to other antibiotic agents status: Secondary | ICD-10-CM | POA: Diagnosis not present

## 2017-10-02 DIAGNOSIS — Z7982 Long term (current) use of aspirin: Secondary | ICD-10-CM | POA: Diagnosis not present

## 2017-10-02 DIAGNOSIS — Z85820 Personal history of malignant melanoma of skin: Secondary | ICD-10-CM | POA: Insufficient documentation

## 2017-10-02 DIAGNOSIS — R51 Headache: Secondary | ICD-10-CM | POA: Insufficient documentation

## 2017-10-02 DIAGNOSIS — E785 Hyperlipidemia, unspecified: Secondary | ICD-10-CM | POA: Diagnosis present

## 2017-10-02 DIAGNOSIS — I2510 Atherosclerotic heart disease of native coronary artery without angina pectoris: Secondary | ICD-10-CM | POA: Insufficient documentation

## 2017-10-02 DIAGNOSIS — R079 Chest pain, unspecified: Secondary | ICD-10-CM | POA: Diagnosis present

## 2017-10-02 DIAGNOSIS — I129 Hypertensive chronic kidney disease with stage 1 through stage 4 chronic kidney disease, or unspecified chronic kidney disease: Principal | ICD-10-CM | POA: Insufficient documentation

## 2017-10-02 DIAGNOSIS — Z79899 Other long term (current) drug therapy: Secondary | ICD-10-CM | POA: Insufficient documentation

## 2017-10-02 DIAGNOSIS — I252 Old myocardial infarction: Secondary | ICD-10-CM

## 2017-10-02 DIAGNOSIS — I10 Essential (primary) hypertension: Secondary | ICD-10-CM | POA: Diagnosis not present

## 2017-10-02 DIAGNOSIS — Z955 Presence of coronary angioplasty implant and graft: Secondary | ICD-10-CM | POA: Diagnosis not present

## 2017-10-02 DIAGNOSIS — Z882 Allergy status to sulfonamides status: Secondary | ICD-10-CM | POA: Insufficient documentation

## 2017-10-02 DIAGNOSIS — Z66 Do not resuscitate: Secondary | ICD-10-CM | POA: Diagnosis not present

## 2017-10-02 HISTORY — DX: Essential (primary) hypertension: I10

## 2017-10-02 HISTORY — DX: Presence of coronary angioplasty implant and graft: Z95.5

## 2017-10-02 HISTORY — DX: Chronic kidney disease, stage 3 unspecified: N18.30

## 2017-10-02 HISTORY — DX: Hyperlipidemia, unspecified: E78.5

## 2017-10-02 HISTORY — DX: Chronic kidney disease, stage 3 (moderate): N18.3

## 2017-10-02 LAB — COMPREHENSIVE METABOLIC PANEL
ALT: 11 U/L — ABNORMAL LOW (ref 14–54)
ANION GAP: 9 (ref 5–15)
AST: 18 U/L (ref 15–41)
Albumin: 3.4 g/dL — ABNORMAL LOW (ref 3.5–5.0)
Alkaline Phosphatase: 88 U/L (ref 38–126)
BUN: 16 mg/dL (ref 6–20)
CO2: 25 mmol/L (ref 22–32)
Calcium: 9.8 mg/dL (ref 8.9–10.3)
Chloride: 103 mmol/L (ref 101–111)
Creatinine, Ser: 1.06 mg/dL — ABNORMAL HIGH (ref 0.44–1.00)
GFR calc Af Amer: 50 mL/min — ABNORMAL LOW (ref >60)
GFR calc non Af Amer: 44 mL/min — ABNORMAL LOW (ref >60)
Glucose, Bld: 99 mg/dL (ref 65–99)
POTASSIUM: 3.8 mmol/L (ref 3.5–5.1)
SODIUM: 137 mmol/L (ref 135–145)
Total Bilirubin: 0.6 mg/dL (ref 0.3–1.2)
Total Protein: 6.1 g/dL — ABNORMAL LOW (ref 6.5–8.1)

## 2017-10-02 LAB — URINALYSIS, ROUTINE W REFLEX MICROSCOPIC
Bilirubin Urine: NEGATIVE
GLUCOSE, UA: NEGATIVE mg/dL
Hgb urine dipstick: NEGATIVE
Ketones, ur: NEGATIVE mg/dL
LEUKOCYTES UA: NEGATIVE
Nitrite: NEGATIVE
PH: 8 (ref 5.0–8.0)
Protein, ur: NEGATIVE mg/dL
Specific Gravity, Urine: 1.01 (ref 1.005–1.030)

## 2017-10-02 LAB — I-STAT CG4 LACTIC ACID, ED
LACTIC ACID, VENOUS: 0.76 mmol/L (ref 0.5–1.9)
LACTIC ACID, VENOUS: 0.57 mmol/L (ref 0.5–1.9)

## 2017-10-02 LAB — CBC
HCT: 37.6 % (ref 36.0–46.0)
Hemoglobin: 12.5 g/dL (ref 12.0–15.0)
MCH: 29.5 pg (ref 26.0–34.0)
MCHC: 33.2 g/dL (ref 30.0–36.0)
MCV: 88.7 fL (ref 78.0–100.0)
PLATELETS: 247 10*3/uL (ref 150–400)
RBC: 4.24 MIL/uL (ref 3.87–5.11)
RDW: 13.9 % (ref 11.5–15.5)
WBC: 7.3 10*3/uL (ref 4.0–10.5)

## 2017-10-02 LAB — I-STAT TROPONIN, ED
Troponin i, poc: 0.01 ng/mL (ref 0.00–0.08)
Troponin i, poc: 0.02 ng/mL (ref 0.00–0.08)

## 2017-10-02 LAB — TROPONIN I
Troponin I: <0.03 ng/mL (ref <0.03)
Troponin I: <0.03 ng/mL (ref <0.03)

## 2017-10-02 MED ORDER — ENOXAPARIN SODIUM 40 MG/0.4ML ~~LOC~~ SOLN
40.0000 mg | SUBCUTANEOUS | Status: DC
  Administered 2017-10-02: 40 mg via SUBCUTANEOUS
  Filled 2017-10-02 (×2): qty 0.4

## 2017-10-02 MED ORDER — ASPIRIN EC 81 MG PO TBEC: 81.0000 mg | DELAYED_RELEASE_TABLET | Freq: Every day | ORAL | Status: DC

## 2017-10-02 MED ORDER — TRAMADOL HCL 50 MG PO TABS: 50.0000 mg | ORAL_TABLET | Freq: Four times a day (QID) | ORAL | Status: DC | PRN

## 2017-10-02 MED ORDER — ONDANSETRON HCL 4 MG/2ML IJ SOLN: 4.0000 mg | Freq: Four times a day (QID) | INTRAMUSCULAR | Status: DC | PRN

## 2017-10-02 MED ORDER — LISINOPRIL 20 MG PO TABS
20.0000 mg | ORAL_TABLET | Freq: Every day | ORAL | Status: DC
  Administered 2017-10-02 – 2017-10-03 (×2): 20 mg via ORAL
  Filled 2017-10-02 (×2): qty 1

## 2017-10-02 MED ORDER — ACETAMINOPHEN 325 MG PO TABS: 650.0000 mg | ORAL_TABLET | ORAL | Status: DC | PRN

## 2017-10-02 MED ORDER — SIMVASTATIN 20 MG PO TABS
20.0000 mg | ORAL_TABLET | Freq: Every evening | ORAL | Status: DC
  Administered 2017-10-02: 20 mg via ORAL
  Filled 2017-10-02 (×2): qty 1

## 2017-10-02 MED ORDER — LISINOPRIL-HYDROCHLOROTHIAZIDE 20-12.5 MG PO TABS: 1.0000 | ORAL_TABLET | Freq: Every day | ORAL | Status: DC

## 2017-10-02 MED ORDER — PANTOPRAZOLE SODIUM 40 MG PO TBEC
40.0000 mg | DELAYED_RELEASE_TABLET | Freq: Every day | ORAL | Status: DC
  Administered 2017-10-02 – 2017-10-03 (×2): 40 mg via ORAL
  Filled 2017-10-02 (×2): qty 1

## 2017-10-02 MED ORDER — ENOXAPARIN SODIUM 30 MG/0.3ML ~~LOC~~ SOLN: 30.0000 mg | SUBCUTANEOUS | Status: DC

## 2017-10-02 MED ORDER — HYDRALAZINE HCL 20 MG/ML IJ SOLN: 5.0000 mg | INTRAMUSCULAR | Status: DC | PRN

## 2017-10-02 MED ORDER — HYDROCHLOROTHIAZIDE 12.5 MG PO CAPS
12.5000 mg | ORAL_CAPSULE | Freq: Every day | ORAL | Status: DC
  Administered 2017-10-02 – 2017-10-03 (×2): 12.5 mg via ORAL
  Filled 2017-10-02 (×2): qty 1

## 2017-10-02 NOTE — ED Notes (Signed)
Report called to [facility] on 3E

## 2017-10-02 NOTE — ED Triage Notes (Signed)
Pt arrives via EMS from home woke this morning with left arm pain, feeling weak, hypertensive. Home bp was 190systolic, pt shakey called EMS. Pt states she then took her blood pressure medicine. EMS pressure 169/69, cbg 134. Pt alert, oriented x4, denies recent fever. Pt reports symptoms have improved since this AM. NAD at present.

## 2017-10-02 NOTE — ED Provider Notes (Signed)
[facility] EMERGENCY DEPARTMENT Provider Note   CSN: [id number] Arrival date & time: 10/02/17  0830     History   Chief Complaint Chief Complaint  Patient presents with  . Weakness    HPI Amber Bender is a 84 y.o. female with a h/o of CKD, MI, macular degeneration, and melanoma who presents to the emergency department with a chief complaint of chest pain, left arm pain, and headache.  The patient endorses left-sided chest pain, characterized as "tightness" that was present when she awoke and resolved "sometime before getting to the the hospital" with associated left arm pain, dizziness, and palpitations. States her chest pain resolved after taking 2 baby ASA. She states "I just didn't feel right this morning." BP at home was 190s/"something". She has not taken her AM dose of lisinopril/HCTZ. She also endorses a mild all-over headache, intermittent productive cough,  and "shaking like a leaf". At this time, she is symptom free.    She denies fever, N/V/D, abdominal pain, back pain, rash, leg swelling, numbness, or weakness.   Cardiologist is Dr. Martinique. She is unsure when she saw him last. Last stress test was "several years ago".   The history is provided by the patient. No language interpreter was used.    Past Medical History:  Diagnosis Date  . Allergy   . Arthritis   . Cancer ([place])   . Cataract   . Chronic kidney disease   . History of heart artery stent   . HLD (hyperlipidemia)   . HTN (hypertension)   . Melanoma ([place])   . Myocardial infarction [facility])     Patient Active Problem List   Diagnosis Date Noted  . Chest pain 10/02/2017  . History of myocardial infarction 10/02/2017    Past Surgical History:  Procedure Laterality Date  . ABDOMINAL HYSTERECTOMY    . APPENDECTOMY    . BREAST SURGERY    . CARDIAC CATHETERIZATION    . EYE SURGERY    . PITUITARY SURGERY     Removal of tumor     OB History   None      Home Medications     Prior to Admission medications   Medication Sig Start Date End Date Taking? Authorizing Provider  Acetaminophen (TYLENOL ARTHRITIS PAIN PO) Take 1 tablet by mouth daily as needed (pain).     [provider]  aspirin 81 MG tablet Take 81 mg by mouth at bedtime.     [provider]  calcium-vitamin D (OSCAL WITH D) 250-125 MG-UNIT per tablet Take 1 tablet by mouth daily.    [provider]  lisinopril-hydrochlorothiazide (PRINZIDE,ZESTORETIC) 20-12.5 MG per tablet Take 1 tablet by mouth daily.    [provider]  Multiple Vitamins-Minerals (PRESERVISION AREDS 2) CAPS Take 1 capsule by mouth 2 (two) times daily.    [provider]  omeprazole (PRILOSEC) 20 MG capsule Take 1 capsule (20 mg total) by mouth daily. 05/30/14   Blanchie Dessert, MD  simvastatin (ZOCOR) 20 MG tablet Take 20 mg by mouth every evening.    [provider]  traMADol (ULTRAM) 50 MG tablet Take 1 tablet (50 mg total) by mouth every 6 (six) hours as needed for severe pain. 04/28/17   Charlann Lange, PA-C    Family History Family History  Problem Relation Age of Onset  . Cancer Mother     Social History Social History   Tobacco Use  . Smoking status: Never Smoker  . Smokeless tobacco:  Never Used  Substance Use Topics  . Alcohol use: Yes    Alcohol/week: 0.0 oz    Comment: occasional  . Drug use: No     Allergies   Erythromycin; Niacin and related; Septra [sulfamethoxazole-trimethoprim]; Ciprofloxacin; and Sulfa antibiotics   Review of Systems Review of Systems  Constitutional: Positive for chills. Negative for activity change and fever.  Respiratory: Negative for shortness of breath.   Cardiovascular: Positive for chest pain and palpitations. Negative for leg swelling.  Gastrointestinal: Negative for abdominal pain, nausea and vomiting.  Genitourinary: Negative for dysuria.  Musculoskeletal: Negative for back pain.  Skin: Negative for rash.   Allergic/Immunologic: Negative for immunocompromised state.  Neurological: Positive for dizziness and headaches. Negative for weakness and numbness.  Psychiatric/Behavioral: Negative for confusion.     Physical Exam Updated Vital Signs BP (!) 166/88 (BP Location: Right Arm)   Pulse 68   Temp 97.7 F (36.5 C) (Oral)   Resp 13   Ht 5' (1.524 m)   Wt 64.4 kg (142 lb)   SpO2 97%   BMI 27.73 kg/m   Physical Exam  Constitutional: She is oriented to person, place, and time. No distress.  HENT:  Head: Normocephalic.  Eyes: Pupils are equal, round, and reactive to light. Conjunctivae and EOM are normal. No scleral icterus.  Neck: Normal range of motion. Neck supple. No JVD present.  Cardiovascular: Normal rate and regular rhythm. Exam reveals no gallop and no friction rub.  No murmur heard. Pulmonary/Chest: Effort normal. No stridor. No respiratory distress. She has no wheezes. She has no rales. She exhibits no tenderness.  Abdominal: Soft. Bowel sounds are normal. She exhibits no distension and no mass. There is no tenderness. There is no rebound and no guarding. No hernia.  Musculoskeletal: Normal range of motion. She exhibits no edema, tenderness or deformity.  Neurological: She is alert and oriented to person, place, and time.  Cranial nerves 2-12 grossly intact. Finger-to-nose is normal. 5/5 motor strength of the bilateral upper and lower extremities. Moves all four extremities. NVI.    Skin: Skin is warm. Capillary refill takes less than 2 seconds. No rash noted. She is not diaphoretic.  Psychiatric: Her behavior is normal.  Nursing note and vitals reviewed.  ED Treatments / Results  Labs (all labs ordered are listed, but only abnormal results are displayed) Labs Reviewed  COMPREHENSIVE METABOLIC PANEL - Abnormal; Notable for the following components:      Result Value   Creatinine, Ser 1.06 (*)    Total Protein 6.1 (*)    Albumin 3.4 (*)    ALT 11 (*)    GFR calc non Af  Amer 44 (*)    GFR calc Af Amer 50 (*)    All other components within normal limits  CBC  URINALYSIS, ROUTINE W REFLEX MICROSCOPIC  TROPONIN I  TROPONIN I  TROPONIN I  I-STAT TROPONIN, ED  I-STAT CG4 LACTIC ACID, ED  I-STAT CG4 LACTIC ACID, ED    EKG EKG Interpretation  Date/Time:  Monday October 02 2017 08:44:25 EDT Ventricular Rate:  69 PR Interval:    QRS Duration: 99 QT Interval:  408 QTC Calculation: 438 R Axis:   -23 Text Interpretation:  Sinus rhythm Borderline left axis deviation Low voltage, precordial leads RSR' in V1 or V2, right VCD or RVH since last tracing no significant change Confirmed by Noemi Chapel [phone]) on 10/02/2017 9:21:24 AM   Radiology Dg Chest 2 View  Result Date: 10/02/2017 CLINICAL DATA:  Chest  pain and shortness of Breath EXAM: CHEST - 2 VIEW COMPARISON:  05/30/2014 FINDINGS: The heart size and mediastinal contours are within normal limits. Both lungs are clear. The visualized skeletal structures show stable scoliosis of the thoracolumbar spine. IMPRESSION: No active cardiopulmonary disease. Electronically Signed   By: Inez Catalina M.D.   On: 10/02/2017 10:44   Ct Head Wo Contrast  Result Date: 10/02/2017 CLINICAL DATA:  Elevated blood pressure and dizziness.  Headache. EXAM: CT HEAD WITHOUT CONTRAST TECHNIQUE: Contiguous axial images were obtained from the base of the skull through the vertex without intravenous contrast. COMPARISON:  02/04/2009 FINDINGS: Brain: There is mild diffuse low-attenuation within the subcortical and periventricular white matter compatible with chronic microvascular disease. Prominence of the sulci and ventricles identified compatible with brain atrophy. No acute intracranial hemorrhage or infarct. No abnormal extra-axial fluid collections or mass. Vascular: No hyperdense vessel or unexpected calcification. Skull: Normal. Negative for fracture or focal lesion. Sinuses/Orbits: No acute finding. Other: None. IMPRESSION: 1. No acute  intracranial abnormalities. 2. Chronic small vessel ischemic change and brain atrophy. Electronically Signed   By: Kerby Moors M.D.   On: 10/02/2017 09:48    Procedures Procedures (including critical care time)  Medications Ordered in ED Medications  aspirin tablet 81 mg (has no administration in time range)  pantoprazole (PROTONIX) EC tablet 40 mg (has no administration in time range)  simvastatin (ZOCOR) tablet 20 mg (has no administration in time range)  traMADol (ULTRAM) tablet 50 mg (has no administration in time range)  lisinopril-hydrochlorothiazide (PRINZIDE,ZESTORETIC) 20-12.5 MG per tablet 1 tablet (has no administration in time range)     Initial Impression / Assessment and Plan / ED Course  I have reviewed the triage vital signs and the nursing notes.  Pertinent labs & imaging results that were available during my care of the patient were reviewed by me and considered in my medical decision making (see chart for details).     84 year old with a h/o of CKD, MI, macular degeneration, and melanoma presenting with left-sided chest pain, left arm pain, dizziness, and headache.   Concern for cardiac etiology of Chest Pain. Cardiology has been consulted and will see patient in the ED for likely admit. Pt does not meet criteria for CP protocol and a further evaluation is recommended. Pt has been re-evaluated prior to consult and VSS, NAD, heart RRR, pain 0/10, lungs CTAB. No acute abnormalities found on EKG and first round of cardiac enzymes negative. Head CT is negative. This case was discussed with Dr. Danford Bad, internal medicine team, who has seen the patient and agrees with plan to admit.   Final Clinical Impressions(s) / ED Diagnoses   Final diagnoses:  Chest pain, unspecified type    ED Discharge Orders    None       Joanne Gavel, PA-C 10/02/17 1549    Noemi Chapel, MD 10/02/17 2142

## 2017-10-02 NOTE — ED Notes (Signed)
checked on pt.s tray per pt. Ordered at 1500, stated, it would come with the others unless someone has told them

## 2017-10-02 NOTE — H&P (Signed)
History and Physical    Amber Bender JOA:[id number] DOB: Jun 27, 1921 DOA: 10/02/2017  **Will place in observation status based on the expectation that the patient will need hospitalization/ hospital care for less than or equal to 24 hours  PCP: [name], MD   Attending physician: Lorin Mercy  Patient coming from/Resides with: Private residence  Chief Complaint: Left arm pain  HPI: Amber Bender is a 84 y.o. female with medical history significant for MI with cardiac stent placed in 1997/Dr. [place] (no records available), hypertension, dyslipidemia, stage III chronic kidney disease, history of pituitary tumor surgery, and arthritis.  Patient reports that she awakened this morning and noticed that her left arm was aching from about the wrist up to the shoulder.  No other associated symptoms.  She took her blood pressure which was elevated SBP 190.  She states she got up and moved around and moved her arm in the pain resolved.  Because she felt her blood pressure was too high she called EMS so she could be evaluated at the hospital.  Upon arrival to the ER her blood pressure was 169/69.  The arm pain had resolved.  There was some concern that the arm pain might be related to ischemia and might be related to chest pain so she was given 2 baby aspirin by EMS.  Patient reports that with previous MI in the 1990s she had significant shortness of breath and fatigue.  She also reports that of recent that her left hand has been "going numb"after she falls asleep on her left arm.  She currently denies left arm weakness or numbness.  Show EKG and troponin unremarkable.  ED Course:  Vital Signs: BP (!) 166/88 (BP Location: Right Arm)   Pulse 68   Temp 97.7 F (36.5 C) (Oral)   Resp 13   Ht 5' (1.524 m)   Wt 64.4 kg (142 lb)   SpO2 97%   BMI 27.73 kg/m  CT head: Unremarkable for chronic small vessel ischemic change and brain atrophy Chest x-ray: Neg Lab data: Sodium 137, potassium 3.8, chloride  103, CO2 25, glucose 99, BUN 16, creatinine 1.6, GFR 44, anion gap 9, calcium 9.8, LFTs not elevated, poc troponin normal, lactic acid normal x2 collections, white count 7300 differential not obtained, hemoglobin 12.5, platelets 247,000, urinalysis mildly abnormal with hazy appearance. Medications and treatments: None  Review of Systems:  In addition to the HPI above,  No Fever-chills, myalgias or other constitutional symptoms No Headache, changes with Vision or hearing, new weakness, tingling, numbness in any extremity except as described above regarding intermittent left hand numbness that occurs after patient has rested/slept on left arm, dizziness, dysarthria or word finding difficulty, gait disturbance or imbalance, tremors or seizure activity No problems swallowing food or Liquids, indigestion/reflux, choking or coughing while eating, abdominal pain with or after eating ? Chest pain, No Cough or Shortness of Breath, palpitations, orthopnea or DOE No Abdominal pain, N/V, melena,hematochezia, dark tarry stools, constipation No dysuria, malodorous urine, hematuria or flank pain No new skin rashes, lesions, masses or bruises, No new joint pains, aches, swelling or redness No recent unintentional weight gain or loss No polyuria, polydypsia or polyphagia   Past Medical History:  Diagnosis Date  . Allergy   . Arthritis   . Cancer ([place])   . Cataract   . CKD (chronic kidney disease), stage III ([place])   . History of heart artery stent   . HLD (hyperlipidemia)   . HTN (hypertension)   .  Melanoma ([place])   . Myocardial infarction [facility])     Past Surgical History:  Procedure Laterality Date  . ABDOMINAL HYSTERECTOMY    . APPENDECTOMY    . BREAST SURGERY    . CARDIAC CATHETERIZATION    . EYE SURGERY    . PITUITARY SURGERY     Removal of tumor    Social History   Socioeconomic History  . Marital status: Widowed    Spouse name: Not on file  . Number of children: Not on file  . Years  of education: Not on file  . Highest education level: Not on file  Occupational History  . Not on file  Social Needs  . Financial resource strain: Not on file  . Food insecurity:    Worry: Not on file    Inability: Not on file  . Transportation needs:    Medical: Not on file    Non-medical: Not on file  Tobacco Use  . Smoking status: Never Smoker  . Smokeless tobacco: Never Used  Substance and Sexual Activity  . Alcohol use: Yes    Alcohol/week: 0.0 oz    Comment: occasional  . Drug use: No  . Sexual activity: Never    Comment: widow  Lifestyle  . Physical activity:    Days per week: Not on file    Minutes per session: Not on file  . Stress: Not on file  Relationships  . Social connections:    Talks on phone: Not on file    Gets together: Not on file    Attends religious service: Not on file    Active member of club or organization: Not on file    Attends meetings of clubs or organizations: Not on file    Relationship status: Not on file  . Intimate partner violence:    Fear of current or ex partner: Not on file    Emotionally abused: Not on file    Physically abused: Not on file    Forced sexual activity: Not on file  Other Topics Concern  . Not on file  Social History Narrative  . Not on file    Mobility: Independent Work history: Not obtained   Allergies  Allergen Reactions  . Erythromycin Other (See Comments)    Stomach cramps  . Niacin And Related Hives  . Septra [Sulfamethoxazole-Trimethoprim]   . Ciprofloxacin Rash  . Sulfa Antibiotics Rash    Family History  Problem Relation Age of Onset  . Cancer Mother     Prior to Admission medications   Medication Sig Start Date End Date Taking? Authorizing Provider  Acetaminophen (TYLENOL ARTHRITIS PAIN PO) Take 1 tablet by mouth daily as needed (pain).    Yes [provider]  aspirin 81 MG tablet Take 81 mg by mouth at bedtime.    Yes [provider]  calcium-vitamin D (OSCAL WITH D)  250-125 MG-UNIT per tablet Take 1 tablet by mouth daily.   Yes [provider]  fluticasone (FLONASE) 50 MCG/ACT nasal spray Place 1 spray into both nostrils as needed for allergies or rhinitis.   Yes [provider]  lisinopril-hydrochlorothiazide (PRINZIDE,ZESTORETIC) 20-12.5 MG per tablet Take 1 tablet by mouth daily.   Yes [provider]  Multiple Vitamins-Minerals (PRESERVISION AREDS 2) CAPS Take 1 capsule by mouth 2 (two) times daily.   Yes [provider]  omeprazole (PRILOSEC) 20 MG capsule Take 1 capsule (20 mg total) by mouth daily. 05/30/14  Yes Blanchie Dessert, MD  Propylene Glycol (  SYSTANE BALANCE) 0.6 % SOLN Apply 1 drop to eye as needed (DRY EYES).   Yes [provider]  simvastatin (ZOCOR) 20 MG tablet Take 20 mg by mouth every evening.   Yes [provider]  traMADol (ULTRAM) 50 MG tablet Take 1 tablet (50 mg total) by mouth every 6 (six) hours as needed for severe pain. Patient not taking: Reported on 10/02/2017 04/28/17   Charlann Lange, PA-C    Physical Exam: Vitals:   10/02/17 0843 10/02/17 0844  BP: (!) 166/88   Pulse: 68   Resp: 13   Temp: 97.7 F (36.5 C)   TempSrc: Oral   SpO2: 97%   Weight:  64.4 kg (142 lb)  Height:  5' (1.524 m)      Constitutional: NAD, calm, comfortable Eyes: PERRL, lids and conjunctivae normal ENMT: Mucous membranes are moist. Posterior pharynx clear of any exudate or lesions.Normal dentition.  Neck: normal, supple, no masses, no thyromegaly Respiratory: clear to auscultation bilaterally, no wheezing, no crackles. Normal respiratory effort. No accessory muscle use.  Cardiovascular: Regular rate and rhythm, grade 1/6 systolic murmur RSB, 2nd ICS, no rubs / gallops. No extremity edema. 2+ pedal pulses. No carotid bruits.  Abdomen: no tenderness, no masses palpated. No hepatosplenomegaly. Bowel sounds positive.  Musculoskeletal: no clubbing / cyanosis. No joint deformity upper and  lower extremities. Good ROM, no contractures. Normal muscle tone.  Skin: no rashes, lesions, ulcers. No induration Neurologic: CN 2-12 grossly intact. Sensation intact, DTR normal. Strength 5/5 x all 4 extremities.  Psychiatric: Alert and oriented x 3. Normal mood.    Labs on Admission: I have personally reviewed following labs and imaging studies  CBC: Recent Labs  Lab 10/02/17 0919  WBC 7.3  HGB 12.5  HCT 37.6  MCV 88.7  PLT 035   Basic Metabolic Panel: Recent Labs  Lab 10/02/17 0919  NA 137  K 3.8  CL 103  CO2 25  GLUCOSE 99  BUN 16  CREATININE 1.06*  CALCIUM 9.8   GFR: Estimated Creatinine Clearance: 27.2 mL/min (A) (by C-G formula based on SCr of 1.06 mg/dL (H)). Liver Function Tests: Recent Labs  Lab 10/02/17 0919  AST 18  ALT 11*  ALKPHOS 88  BILITOT 0.6  PROT 6.1*  ALBUMIN 3.4*   No results for input(s): LIPASE, AMYLASE in the last 168 hours. No results for input(s): AMMONIA in the last 168 hours. Coagulation Profile: No results for input(s): INR, PROTIME in the last 168 hours. Cardiac Enzymes: No results for input(s): CKTOTAL, CKMB, CKMBINDEX, TROPONINI in the last 168 hours. BNP (last 3 results) No results for input(s): PROBNP in the last 8760 hours. HbA1C: No results for input(s): HGBA1C in the last 72 hours. CBG: No results for input(s): GLUCAP in the last 168 hours. Lipid Profile: No results for input(s): CHOL, HDL, LDLCALC, TRIG, CHOLHDL, LDLDIRECT in the last 72 hours. Thyroid Function Tests: No results for input(s): TSH, T4TOTAL, FREET4, T3FREE, THYROIDAB in the last 72 hours. Anemia Panel: No results for input(s): VITAMINB12, FOLATE, FERRITIN, TIBC, IRON, RETICCTPCT in the last 72 hours. Urine analysis:    Component Value Date/Time   COLORURINE YELLOW 10/02/2017 0910   APPEARANCEUR HAZY (A) 10/02/2017 0910   LABSPEC 1.010 10/02/2017 0910   PHURINE 8.0 10/02/2017 0910   GLUCOSEU NEGATIVE 10/02/2017 0910   HGBUR NEGATIVE 10/02/2017  0910   BILIRUBINUR NEGATIVE 10/02/2017 0910   BILIRUBINUR neg 04/02/2012 1618   KETONESUR NEGATIVE 10/02/2017 0910   PROTEINUR NEGATIVE 10/02/2017 0910  UROBILINOGEN 0.2 05/30/2014 1423   NITRITE NEGATIVE 10/02/2017 0910   LEUKOCYTESUR NEGATIVE 10/02/2017 0910   Sepsis Labs: @LABRCNTIP (procalcitonin:4,lacticidven:4) )No results found for this or any previous visit (from the past 240 hour(s)).   Radiological Exams on Admission: Dg Chest 2 View  Result Date: 10/02/2017 CLINICAL DATA:  Chest pain and shortness of Breath EXAM: CHEST - 2 VIEW COMPARISON:  05/30/2014 FINDINGS: The heart size and mediastinal contours are within normal limits. Both lungs are clear. The visualized skeletal structures show stable scoliosis of the thoracolumbar spine. IMPRESSION: No active cardiopulmonary disease. Electronically Signed   By: Inez Catalina M.D.   On: 10/02/2017 10:44   Ct Head Wo Contrast  Result Date: 10/02/2017 CLINICAL DATA:  Elevated blood pressure and dizziness.  Headache. EXAM: CT HEAD WITHOUT CONTRAST TECHNIQUE: Contiguous axial images were obtained from the base of the skull through the vertex without intravenous contrast. COMPARISON:  02/04/2009 FINDINGS: Brain: There is mild diffuse low-attenuation within the subcortical and periventricular white matter compatible with chronic microvascular disease. Prominence of the sulci and ventricles identified compatible with brain atrophy. No acute intracranial hemorrhage or infarct. No abnormal extra-axial fluid collections or mass. Vascular: No hyperdense vessel or unexpected calcification. Skull: Normal. Negative for fracture or focal lesion. Sinuses/Orbits: No acute finding. Other: None. IMPRESSION: 1. No acute intracranial abnormalities. 2. Chronic small vessel ischemic change and brain atrophy. Electronically Signed   By: Kerby Moors M.D.   On: 10/02/2017 09:48    EKG: (Independently reviewed) sinus rhythm with ventricular rate 69 bpm, QTC 400,  normal R wave rotation, elevated J-point V2 V3, no acute ischemic changes  Assessment/Plan Principal Problem:   Chest pain/ History of myocardial infarction/prior stent -Presents with left arm "achiness" extending from wrist to shoulder that improved after baby aspirin and active range of motion.  Patient denies associated symptoms and/or chest pain concurrently with arm pain. -Patient does have a prior history of MI and apparent cardiac stent dating back to 1997 but these records are unavailable to review -Cycle troponin -Echocardiogram -Telemetry monitoring -Continue preadmission baby aspirin and statin -Was not on beta-blocker prior to admission  Active Problems:   HTN (hypertension) -Continue preadmission lisinopril HCTZ    HLD (hyperlipidemia) -Continue Zocor    CKD (chronic kidney disease), stage III  -Renal function/GFR stable and at baseline    **Additional lab, imaging and/or diagnostic evaluation at discretion of supervising physician  DVT prophylaxis: Lovenox Code Status: Full Family Communication: No family at bedside Disposition Plan: Home Consults called: None    Brennyn Haisley L. ANP-BC [facility] Pager [phone]   If 7PM-7AM, please contact night-coverage [email or web address] Password [facility]  10/02/2017, 12:27 PM

## 2017-10-02 NOTE — ED Notes (Signed)
Pt placed on a Purewick external catheter

## 2017-10-02 NOTE — ED Notes (Signed)
Pt. Stated, Im not eating nothing but a regular diet. I need some ice cream and my pain medication. Im hurting all over.

## 2017-10-02 NOTE — ED Notes (Signed)
Family at bedside. 

## 2017-10-02 NOTE — ED Notes (Signed)
Pt. Ate 100% of dinner.

## 2017-10-02 NOTE — ED Provider Notes (Signed)
Medical screening examination/treatment/procedure(s) were conducted as a shared visit with non-physician practitioner(s) and myself.  I personally evaluated the patient during the encounter.  Clinical Impression:   Final diagnoses:  Chest pain, unspecified type    Elderly 84 year old lady presents with a tightness in the left side of her chest along with some left arm symptoms.  Most of this had resolved by the time she arrived and she has been chest symptom-free, however she does have some intermittent discomfort in her left hand and arm.  She denies any coughing fevers or swelling of the legs to me.  And on my exam the patient has clear lungs, clear heart sounds, very soft systolic murmur, soft abdomen, normal oropharynx without any dryness or exudate.  EKG without acute findings other than low voltage  The patient would need to be admitted for cardiac rule out.    EKG Interpretation  Date/Time:  Monday October 02 2017 08:44:25 EDT Ventricular Rate:  69 PR Interval:    QRS Duration: 99 QT Interval:  408 QTC Calculation: 438 R Axis:   -23 Text Interpretation:  Sinus rhythm Borderline left axis deviation Low voltage, precordial leads RSR' in V1 or V2, right VCD or RVH since last tracing no significant change Confirmed by Noemi Chapel [phone]) on 10/02/2017 9:21:24 AM         Noemi Chapel, MD 10/02/17 2142

## 2017-10-02 NOTE — ED Notes (Signed)
Meal Tray ordered, Pt given ICE water and snack.

## 2017-10-02 NOTE — ED Notes (Signed)
Report to Annie, RN.

## 2017-10-03 ENCOUNTER — Encounter (HOSPITAL_COMMUNITY): Payer: Self-pay

## 2017-10-03 ENCOUNTER — Observation Stay (HOSPITAL_BASED_OUTPATIENT_CLINIC_OR_DEPARTMENT_OTHER): Payer: Medicare Other

## 2017-10-03 DIAGNOSIS — N183 Chronic kidney disease, stage 3 (moderate): Secondary | ICD-10-CM

## 2017-10-03 DIAGNOSIS — R079 Chest pain, unspecified: Secondary | ICD-10-CM | POA: Diagnosis not present

## 2017-10-03 DIAGNOSIS — E785 Hyperlipidemia, unspecified: Secondary | ICD-10-CM

## 2017-10-03 DIAGNOSIS — R072 Precordial pain: Secondary | ICD-10-CM

## 2017-10-03 DIAGNOSIS — M79602 Pain in left arm: Secondary | ICD-10-CM

## 2017-10-03 DIAGNOSIS — I2510 Atherosclerotic heart disease of native coronary artery without angina pectoris: Secondary | ICD-10-CM | POA: Diagnosis not present

## 2017-10-03 DIAGNOSIS — I10 Essential (primary) hypertension: Secondary | ICD-10-CM | POA: Diagnosis not present

## 2017-10-03 DIAGNOSIS — I252 Old myocardial infarction: Secondary | ICD-10-CM | POA: Diagnosis not present

## 2017-10-03 LAB — LIPID PANEL
CHOLESTEROL: 152 mg/dL (ref 0–200)
HDL: 37 mg/dL — ABNORMAL LOW (ref >40)
LDL CALC: 73 mg/dL (ref 0–99)
Total CHOL/HDL Ratio: 4.1 RATIO
Triglycerides: 210 mg/dL — ABNORMAL HIGH (ref <150)
VLDL: 42 mg/dL — AB (ref 0–40)

## 2017-10-03 LAB — ECHOCARDIOGRAM COMPLETE
Height: 60 in
Weight: 2229.29 oz

## 2017-10-03 LAB — TROPONIN I: Troponin I: <0.03 ng/mL (ref <0.03)

## 2017-10-03 MED ORDER — TRAMADOL HCL 50 MG PO TABS: 50.0000 mg | ORAL_TABLET | Freq: Four times a day (QID) | ORAL | 0 refills | Status: AC | PRN

## 2017-10-03 NOTE — Consult Note (Addendum)
Cardiology Consultation:   Patient ID: Amber Bender; [id number]; 29-Aug-1920   Admit date: 10/02/2017 Date of Consult: 10/03/2017  Primary Care Provider: [name], MD Primary Cardiologist: Dr. Martinique very remotely (wishes to stay with him)  Patient Profile:   Amber Bender is a 84 y.o. functional female with a past medical history significant for MI s/p cardiac stent (1997), HTN, dyslipidemia, CKD Stage III, hx of pituitary adenoma s/p TSS, melanoma and osteoarthritis who is being seen today for the evaluation of arm pain at the request of the primary team.   History of Present Illness:   Amber Bender reports that Amber Bender woke up yesterday morning with an aching left arm pain radiating into the left jaw, spanning from the wrist up to Amber Bender shoulder. At this time, patient reports feeling weak, dizzy, palpitations and "just not right," so Amber Bender decided to check Amber Bender BP which was elevated with a SBP of 190. Amber Bender denied any associated chest pain, N/V, SOB, AMS, changes in vision or speech. When Amber Bender got up and started to move around, the arm pain resolved on its own. However, due to Amber Bender blood pressure reading, Amber Bender son decided to call EMS to be evaluated at the hospital. Amber Bender was given 2 baby ASA during transport and in the ER, BP read 169/66 without arm pain.  Amber Bender reports that when Amber Bender had Amber Bender previous MI in 1997, the pain was different with more significant SOB, weakness, and fatigue but no chest pain. Amber Bender states that Amber Bender follows with Dr. Martinique as Amber Bender cardiologist and believes Amber Bender may have had Amber Bender last stress test done more than five years ago, with no abnormal results.   Patient reports that Amber Bender lives with Amber Bender son who is mostly there during the nights, but continues to be very independent, making frequent trips (drives herself) to [facility] to visit Amber Bender daughter and participating in water aerobics 3x a week. Denies any tobacco or drug use, but does endorse 2-3 glasses of wine each week.   Ms.  Bender denies having any exertional chest pain with activities, but does endorse mild shortness of breath when walking briskly or carrying a large amount of items. Denies any orthopnea, PND, or LEE. Amber Bender reports having some mild chest burning sensation at night time, but relates this to Amber Bender symptoms of acid reflex (currently on Protonix). Amber Bender does endorse having intermittent numbness and tingling in the left arm for the past few months, but it does not appear to be consistent with any specific activities or positional changes.   In the ED, patient had elevated BP (166/88), afebrile, O2 97% on RA. EKG demonstrated NSR without acute ischemic changes and no changes since last reading three years ago. Troponin negative x 5. CXR without active cardiopulmonary disease. CT head unremarkable except for chronic small vessel ischemic changes. UA, CBC and CMP unremarkable.   Today, patient denies any chest pain, arm pain/numbness/tingling, shortness of breath and is feeling overall better.   Past Medical History:  Diagnosis Date  . Allergy   . Arthritis   . Cancer ([place])   . Cataract   . CKD (chronic kidney disease), stage III ([place])   . History of heart artery stent   . HLD (hyperlipidemia)   . HTN (hypertension)   . Melanoma ([place])   . Myocardial infarction [facility])     Past Surgical History:  Procedure Laterality Date  . ABDOMINAL HYSTERECTOMY    . APPENDECTOMY    . BREAST SURGERY    .  CARDIAC CATHETERIZATION    . EYE SURGERY    . PITUITARY SURGERY     Removal of tumor     Prior to Admission medications   Medication Sig Start Date End Date Taking? Authorizing Provider  Acetaminophen (TYLENOL ARTHRITIS PAIN PO) Take 1 tablet by mouth daily as needed (pain).    Yes [provider]  aspirin 81 MG tablet Take 81 mg by mouth at bedtime.    Yes [provider]  calcium-vitamin D (OSCAL WITH D) 250-125 MG-UNIT per tablet Take 1 tablet by mouth daily.   Yes [provider]    fluticasone (FLONASE) 50 MCG/ACT nasal spray Place 1 spray into both nostrils as needed for allergies or rhinitis.   Yes [provider]  lisinopril-hydrochlorothiazide (PRINZIDE,ZESTORETIC) 20-12.5 MG per tablet Take 1 tablet by mouth daily.   Yes [provider]  Multiple Vitamins-Minerals (PRESERVISION AREDS 2) CAPS Take 1 capsule by mouth 2 (two) times daily.   Yes [provider]  omeprazole (PRILOSEC) 20 MG capsule Take 1 capsule (20 mg total) by mouth daily. 05/30/14  Yes Plunkett, Loree Fee, MD  Propylene Glycol (SYSTANE BALANCE) 0.6 % SOLN Apply 1 drop to eye as needed (DRY EYES).   Yes [provider]  simvastatin (ZOCOR) 20 MG tablet Take 20 mg by mouth every evening.   Yes [provider]  traMADol (ULTRAM) 50 MG tablet Take 1 tablet (50 mg total) by mouth every 6 (six) hours as needed for severe pain. Patient not taking: Reported on 10/02/2017 04/28/17   Charlann Lange, PA-C    Inpatient Medications: Scheduled Meds: . aspirin EC  81 mg Oral QHS  . enoxaparin (LOVENOX) injection  30 mg Subcutaneous Q24H  . lisinopril  20 mg Oral Daily   And  . hydrochlorothiazide  12.5 mg Oral Daily  . pantoprazole  40 mg Oral Daily  . simvastatin  20 mg Oral QPM   Continuous Infusions:  PRN Meds: acetaminophen, hydrALAZINE, ondansetron (ZOFRAN) IV, traMADol  Allergies:    Allergies  Allergen Reactions  . Erythromycin Other (See Comments)    Stomach cramps  . Niacin And Related Hives  . Septra [Sulfamethoxazole-Trimethoprim]   . Ciprofloxacin Rash  . Sulfa Antibiotics Rash    Social History:   Social History   Socioeconomic History  . Marital status: Widowed    Spouse name: Not on file  . Number of children: Not on file  . Years of education: Not on file  . Highest education level: Not on file  Occupational History  . Not on file  Social Needs  . Financial resource strain: Not hard at all  . Food insecurity:    Worry: Patient  refused    Inability: Patient refused  . Transportation needs:    Medical: Patient refused    Non-medical: Patient refused  Tobacco Use  . Smoking status: Never Smoker  . Smokeless tobacco: Never Used  Substance and Sexual Activity  . Alcohol use: Yes    Alcohol/week: 0.0 oz    Comment: occasional  . Drug use: No  . Sexual activity: Not Currently    Comment: widow  Lifestyle  . Physical activity:    Days per week: Patient refused    Minutes per session: Patient refused  . Stress: Not at all  Relationships  . Social connections:    Talks on phone: Twice a week    Gets together: Twice a week    Attends religious service: 1 to 4 times per year  Active member of club or organization: Yes    Attends meetings of clubs or organizations: 1 to 4 times per year    Relationship status: Widowed  . Intimate partner violence:    Fear of current or ex partner: Patient refused    Emotionally abused: Patient refused    Physically abused: Patient refused    Forced sexual activity: Patient refused  Other Topics Concern  . Not on file  Social History Narrative  . Not on file    Family History:   Family History  Problem Relation Age of Onset  . Cancer Mother    Family Status:  Family Status  Relation Name Status  . Mother  Deceased  . Father  Deceased  . Brother  Deceased    ROS:  Please see the history of present illness.  All other ROS reviewed and negative.     Physical Exam/Data:   Vitals:   10/02/17 2000 10/02/17 2040 10/03/17 0014 10/03/17 0500  BP: 134/62 (!) 163/64 (!) 133/54 (!) 123/48  Pulse: 68 65 73 68  Resp: 14 16 18 18   Temp:  97.6 F (36.4 C) 98.5 F (36.9 C) 98.5 F (36.9 C)  TempSrc:  Oral Oral Oral  SpO2: 97% 98% 96% 93%  Weight:  140 lb 10.5 oz (63.8 kg)  139 lb 5.3 oz (63.2 kg)  Height:  5' (1.524 m)      Intake/Output Summary (Last 24 hours) at 10/03/2017 0823 Last data filed at 10/03/2017 0424 Gross per 24 hour  Intake 0 ml  Output 500 ml    Net -500 ml   Filed Weights   10/02/17 0844 10/02/17 2040 10/03/17 0500  Weight: 142 lb (64.4 kg) 140 lb 10.5 oz (63.8 kg) 139 lb 5.3 oz (63.2 kg)   Body mass index is 27.21 kg/m.  General:  Well nourished, well developed elderly WF, in no acute distress-appears younger than stated age 17: normal Lymph: no adenopathy Neck: no JVD Endocrine:  No thryomegaly Vascular: No carotid bruits; 4/4 extremity pulses 2+, without bruits  Cardiac:  normal S1, S2; RRR; soft slow sound heard 2nd ICS, no rubs or gallops  Lungs:  clear to auscultation bilaterally, no wheezing, rhonchi or rales  Abd: soft, nontender, no hepatomegaly  Ext: no edema Musculoskeletal:  No deformities, BUE and BLE strength normal and equal Skin: warm and dry  Neuro:  CNs 2-12 intact, no focal abnormalities noted Psych:  Normal affect   EKG:  The EKG was personally reviewed and demonstrates:  NSR, HR 70, without acute ischemic changes  Telemetry:  Telemetry was personally reviewed and demonstrates:  NSR, HR in 60's   Relevant CV Studies:  ECHO: ordered 10/03/17, results pending   CATH: patients reports having cath in the past, unknown date; no reports found   Laboratory Data:  Chemistry Recent Labs  Lab 10/02/17 0919  NA 137  K 3.8  CL 103  CO2 25  GLUCOSE 99  BUN 16  CREATININE 1.06*  CALCIUM 9.8  GFRNONAA 44*  GFRAA 50*  ANIONGAP 9    Lab Results  Component Value Date   ALT 11 (L) 10/02/2017   AST 18 10/02/2017   ALKPHOS 88 10/02/2017   BILITOT 0.6 10/02/2017   Hematology Recent Labs  Lab 10/02/17 0919  WBC 7.3  RBC 4.24  HGB 12.5  HCT 37.6  MCV 88.7  MCH 29.5  MCHC 33.2  RDW 13.9  PLT 247   Cardiac Enzymes Recent Labs  Lab 10/02/17 1217  10/02/17 1728 10/02/17 2345  TROPONINI <0.03 <0.03 <0.03    Recent Labs  Lab 10/02/17 0926 10/02/17 1225  TROPIPOC 0.01 0.02    BNPNo results for input(s): BNP, PROBNP in the last 168 hours.  DDimer No results for input(s): DDIMER in  the last 168 hours. TSH: No results found for: TSH Lipids: Lab Results  Component Value Date   CHOL 152 10/03/2017   HDL 37 (L) 10/03/2017   LDLCALC 73 10/03/2017   TRIG 210 (H) 10/03/2017   CHOLHDL 4.1 10/03/2017   HgbA1c:No results found for: HGBA1C Magnesium: No results found for: MG   Radiology/Studies:  Dg Chest 2 View  Result Date: 10/02/2017 CLINICAL DATA:  Chest pain and shortness of Breath EXAM: CHEST - 2 VIEW COMPARISON:  05/30/2014 FINDINGS: The heart size and mediastinal contours are within normal limits. Both lungs are clear. The visualized skeletal structures show stable scoliosis of the thoracolumbar spine. IMPRESSION: No active cardiopulmonary disease. Electronically Signed   By: Inez Catalina M.D.   On: 10/02/2017 10:44   Ct Head Wo Contrast  Result Date: 10/02/2017 CLINICAL DATA:  Elevated blood pressure and dizziness.  Headache. EXAM: CT HEAD WITHOUT CONTRAST TECHNIQUE: Contiguous axial images were obtained from the base of the skull through the vertex without intravenous contrast. COMPARISON:  02/04/2009 FINDINGS: Brain: There is mild diffuse low-attenuation within the subcortical and periventricular white matter compatible with chronic microvascular disease. Prominence of the sulci and ventricles identified compatible with brain atrophy. No acute intracranial hemorrhage or infarct. No abnormal extra-axial fluid collections or mass. Vascular: No hyperdense vessel or unexpected calcification. Skull: Normal. Negative for fracture or focal lesion. Sinuses/Orbits: No acute finding. Other: None. IMPRESSION: 1. No acute intracranial abnormalities. 2. Chronic small vessel ischemic change and brain atrophy. Electronically Signed   By: Kerby Moors M.D.   On: 10/02/2017 09:48    Assessment and Plan:   1. Left Arm Pain, hx of MI s/p stent 1997  - patient presented 10/02/17 to ED with left arm aching pain that improved before EMS arrival with ASA and active ROM; no associated  chest pain  - records from prior MI/stent unavailable for review  - troponin negative x 3 - EKG shows NSR without acute ischemic changes - CXR unremarkable for acute cardiopulmonary disease  - Echo ordered 10/03/17, results pending  - Continue with telemetry monitoring - Continue home medication: ASA, Zocor - currently NPO; possibility of continuing with stress test, but may results may not be as beneficial due to patient age and could consider treating medically instead   2. HTN - BP elevated upon arrival, unclear if this contributed to symptoms or was the result of symptoms - continue home medication: Lisinopril-HCTZ  3. HLD  - 10/03/17 FLP: cholesterol: 152, TG: 210, LDL: 73, HDL: 37  - Continue Zocor   4. CKD Stage III - renal function stable and at baseline for patient (BUN/Cr: 16/1.06)   For questions or updates, please contact [place] Please consult [email or web address] for contact info under Cardiology/STEMI.   Signed, Katina Degree, Student-PA  10/03/2017 8:23 AM   Patient seen/examined independently and case discussed with PA student. Agree with assessment above; changes made where necessary. With 1 hour of L arm pain which was somewhat atypical, ischemic markers have been negative and EKG is nonacute. It could be this was related to blood pressure spike or the pain itself could have caused the BP to increase. Amber Bender has not had any angina with regular physical activity. Amber Bender  L arm discomfort actually improved with activity, arguing against ischemic cause. Would favor conservative management with close OP f/u to determine if stress testing is necessary (in case of recurrent symptoms). Amber Bender is now normotensive. Do not feel inpatient stress test is warranted. 2D echo has been done and is pending - note that we do not have a prior EF to compare to. If unremarkable, anticipate DC today - will place followup on electronic AVS. Will review final recs with Dr. Acie Fredrickson.  Lisbeth Renshaw Dunn  PA-C 10/03/2017 10:36 AM  Attending Note:   The patient was seen and examined.  Agree with assessment and plan as noted above.  Changes made to the above note as needed.  Patient seen and independently examined with Rosalie Gums, PA-S and Melina Copa, PA .   We discussed all aspects of the encounter. I agree with the assessment and plan as stated above.  1.  Arm pain the patient presents with left arm pain that sounds more like a musculoskeletal ache.  Amber Bender has a history of coronary artery disease and has had some generalized weakness and shortness of breath in the past that is Amber Bender anginal equivalent.  The pain that brought Amber Bender into the hospital for this admission is not at all similar to Amber Bender previous episodes of angina.  Amber Bender remains very active for a 84 year old.  Amber Bender does water aerobics on a regular basis.  Amber Bender troponin levels are negative.  Amber Bender EKG is nonacute.  At this point Amber Bender appears to be safe to be discharged from hospital.  Amber Bender will follow-up with Dr. Martinique in the office in the next several weeks.  We will decide from there if Amber Bender needs further workup.  2.  Hypertension: Amber Bender blood pressure initially was slightly elevated.  This may been contributing to some of the discomfort.  Amber Bender blood pressure is normal now.  Amber Bender will continue to follow-up with [place] and with Dr. Joya Salm.   I have spent a total of 40 minutes with patient reviewing hospital  notes , telemetry, EKGs, labs and examining patient as well as establishing an assessment and plan that was discussed with the patient. > 50% of time was spent in direct patient care.    Thayer Headings, Brooke Bonito., MD, [facility] 10/03/2017, 10:57 AM [address],  [place] Pager [phone]

## 2017-10-03 NOTE — Progress Notes (Signed)
2D Echocardiogram has been performed.  Amber Bender 10/03/2017, 10:07 AM

## 2017-10-03 NOTE — Progress Notes (Signed)
Reviewed discharge instruction/medications with patient. Answered her questions. Pt is stable and ready for discharge. Waiting on her son to pick her up.

## 2017-10-03 NOTE — Discharge Summary (Signed)
Physician Discharge Summary  Amber Bender JAS:[id number] DOB: October 12, 1920 DOA: 10/02/2017  PCP: [name], MD  Admit date: 10/02/2017 Discharge date: 10/03/2017  Admitted From: Home Disposition:  Home  Recommendations for Outpatient Follow-up:  1. Follow up with PCP in 1 week 2. Follow up with Cardiology as scheduled  Home Health: No  Equipment/Devices: None  Discharge Condition: Stable  CODE STATUS: DNR  Diet recommendation: Heart Healthy   Brief/Interim Summary: 84 yo female with history of coronary artery disease status post stent, hypertension, hyperlipidemia, stage III CKD, history of pituitary tumor surgery and arthritis presented with left arm pain. Patient was kept in observation for the same. Troponins have been negative. Cardiology evaluated the patient and cleared the patient for discharge. Her left arm pain is improving.  Discharge Diagnoses:  Principal Problem:   Chest pain Active Problems:   History of myocardial infarction/prior stent   HTN (hypertension)   HLD (hyperlipidemia)   CKD (chronic kidney disease), stage III (HCC)  Left arm pain in a patient with history of CAD and stent -Improving. Troponins have been negative. Cardiology evaluation appreciated. Echo showed EF of 65-70% with grade I diastolic dysfunction. Cardiology has cleared the patient for discharge with outpatient follow-up with cardiology. - continue aspirin  HTN (hypertension) -Continue  Lisinopril/HCTZ    HLD (hyperlipidemia) -Continue Zocor    CKD (chronic kidney disease), stage III  -Renal function/GFR stable and at baseline. Outpatient followup.    Discharge Instructions  Discharge Instructions    Ambulatory referral to Cardiology   Complete by:  As directed    Hospital follow up for Chest pain in 1-2 weeks   Call MD for:  difficulty breathing, headache or visual disturbances   Complete by:  As directed    Call MD for:  extreme fatigue   Complete by:  As directed     Call MD for:  hives   Complete by:  As directed    Call MD for:  persistant dizziness or light-headedness   Complete by:  As directed    Call MD for:  persistant nausea and vomiting   Complete by:  As directed    Call MD for:  severe uncontrolled pain   Complete by:  As directed    Call MD for:  temperature >100.4   Complete by:  As directed    Diet - low sodium heart healthy   Complete by:  As directed    Increase activity slowly   Complete by:  As directed      Allergies as of 10/03/2017      Reactions   Erythromycin Other (See Comments)   Stomach cramps   Niacin And Related Hives   Septra [sulfamethoxazole-trimethoprim]    Ciprofloxacin Rash   Sulfa Antibiotics Rash      Medication List    TAKE these medications   aspirin 81 MG tablet Take 81 mg by mouth at bedtime.   calcium-vitamin D 250-125 MG-UNIT tablet Commonly known as:  OSCAL WITH D Take 1 tablet by mouth daily.   fluticasone 50 MCG/ACT nasal spray Commonly known as:  FLONASE Place 1 spray into both nostrils as needed for allergies or rhinitis.   lisinopril-hydrochlorothiazide 20-12.5 MG tablet Commonly known as:  PRINZIDE,ZESTORETIC Take 1 tablet by mouth daily.   omeprazole 20 MG capsule Commonly known as:  PRILOSEC Take 1 capsule (20 mg total) by mouth daily.   PRESERVISION AREDS 2 Caps Take 1 capsule by mouth 2 (two) times daily.   simvastatin 20  MG tablet Commonly known as:  ZOCOR Take 20 mg by mouth every evening.   SYSTANE BALANCE 0.6 % Soln Generic drug:  Propylene Glycol Apply 1 drop to eye as needed (DRY EYES).   traMADol 50 MG tablet Commonly known as:  ULTRAM Take 1 tablet (50 mg total) by mouth every 6 (six) hours as needed for severe pain.   TYLENOL ARTHRITIS PAIN PO Take 1 tablet by mouth daily as needed (pain).      Follow-up Information    Lendon Colonel, NP Follow up.   Specialties:  Nurse Practitioner, Radiology, Cardiology Why:  [place] office  in [facility] in the same building as [employer] - 10/17/17 at 11am. Arrive 15 minutes prior to appt. Curt Bears is a Designer, jewellery that works with Dr. Martinique. Contact information: [address] [place] [address] [phone]        [name], MD. Schedule an appointment as soon as possible for a visit in 1 week(s).   Specialty:  Internal Medicine Contact information: [place] [address] [phone]          Allergies  Allergen Reactions  . Erythromycin Other (See Comments)    Stomach cramps  . Niacin And Related Hives  . Septra [Sulfamethoxazole-Trimethoprim]   . Ciprofloxacin Rash  . Sulfa Antibiotics Rash    Consultations:  . Cardiology   Procedures/Studies: Dg Chest 2 View  Result Date: 10/02/2017 CLINICAL DATA:  Chest pain and shortness of Breath EXAM: CHEST - 2 VIEW COMPARISON:  05/30/2014 FINDINGS: The heart size and mediastinal contours are within normal limits. Both lungs are clear. The visualized skeletal structures show stable scoliosis of the thoracolumbar spine. IMPRESSION: No active cardiopulmonary disease. Electronically Signed   By: Inez Catalina M.D.   On: 10/02/2017 10:44   Ct Head Wo Contrast  Result Date: 10/02/2017 CLINICAL DATA:  Elevated blood pressure and dizziness.  Headache. EXAM: CT HEAD WITHOUT CONTRAST TECHNIQUE: Contiguous axial images were obtained from the base of the skull through the vertex without intravenous contrast. COMPARISON:  02/04/2009 FINDINGS: Brain: There is mild diffuse low-attenuation within the subcortical and periventricular white matter compatible with chronic microvascular disease. Prominence of the sulci and ventricles identified compatible with brain atrophy. No acute intracranial hemorrhage or infarct. No abnormal extra-axial fluid collections or mass. Vascular: No hyperdense vessel or unexpected calcification. Skull: Normal. Negative for fracture or focal lesion. Sinuses/Orbits: No  acute finding. Other: None. IMPRESSION: 1. No acute intracranial abnormalities. 2. Chronic small vessel ischemic change and brain atrophy. Electronically Signed   By: Kerby Moors M.D.   On: 10/02/2017 09:48    Echo on 10/03/2017 Study Conclusions  - Left ventricle: The cavity size was normal. Wall thickness was   normal. Systolic function was vigorous. The estimated ejection   fraction was in the range of 65% to 70%. Wall motion was normal;   there were no regional wall motion abnormalities. Doppler   parameters are consistent with abnormal left ventricular   relaxation (grade 1 diastolic dysfunction). Doppler parameters   are consistent with high ventricular filling pressure.  Impressions:  - Vigorous LV systolic function; mild diastolic dysfunction;   elevated LV filling pressure.  Subjective: Patient seen and examined at bedside.She feels better and her left arm pain is much improved. No overnight fever, chest pain or vomiting.  Discharge Exam: Vitals:   10/03/17 0843 10/03/17 1136  BP: (!) 115/58 (!) 115/48  Pulse: 73 66  Resp:  18  Temp:  97.7 F (36.5 C)  SpO2: 95% 93%   Vitals:   10/03/17 0500 10/03/17 0830 10/03/17 0843 10/03/17 1136  BP: (!) 123/48 132/62 (!) 115/58 (!) 115/48  Pulse: 68 70 73 66  Resp: 18 18  18   Temp: 98.5 F (36.9 C) 97.8 F (36.6 C)  97.7 F (36.5 C)  TempSrc: Oral Oral  Oral  SpO2: 93% 96% 95% 93%  Weight: 63.2 kg (139 lb 5.3 oz)     Height:        General: Pt is alert, awake, not in acute distress Cardiovascular: rate controlled, S1/S2 + Respiratory: bilateral decreased breath sounds bases Abdominal: Soft, NT, ND, bowel sounds + Extremities: no edema, no cyanosis    The results of significant diagnostics from this hospitalization (including imaging, microbiology, ancillary and laboratory) are listed below for reference.     Microbiology: No results found for this or any previous visit (from the past 240 hour(s)).    Labs: BNP (last 3 results) No results for input(s): BNP in the last 8760 hours. Basic Metabolic Panel: Recent Labs  Lab 10/02/17 0919  NA 137  K 3.8  CL 103  CO2 25  GLUCOSE 99  BUN 16  CREATININE 1.06*  CALCIUM 9.8   Liver Function Tests: Recent Labs  Lab 10/02/17 0919  AST 18  ALT 11*  ALKPHOS 88  BILITOT 0.6  PROT 6.1*  ALBUMIN 3.4*   No results for input(s): LIPASE, AMYLASE in the last 168 hours. No results for input(s): AMMONIA in the last 168 hours. CBC: Recent Labs  Lab 10/02/17 0919  WBC 7.3  HGB 12.5  HCT 37.6  MCV 88.7  PLT 247   Cardiac Enzymes: Recent Labs  Lab 10/02/17 1217 10/02/17 1728 10/02/17 2345  TROPONINI <0.03 <0.03 <0.03   BNP: Invalid input(s): POCBNP CBG: No results for input(s): GLUCAP in the last 168 hours. D-Dimer No results for input(s): DDIMER in the last 72 hours. Hgb A1c No results for input(s): HGBA1C in the last 72 hours. Lipid Profile Recent Labs    10/03/17 0509  CHOL 152  HDL 37*  LDLCALC 73  TRIG 210*  CHOLHDL 4.1   Thyroid function studies No results for input(s): TSH, T4TOTAL, T3FREE, THYROIDAB in the last 72 hours.  Invalid input(s): FREET3 Anemia work up No results for input(s): VITAMINB12, FOLATE, FERRITIN, TIBC, IRON, RETICCTPCT in the last 72 hours. Urinalysis    Component Value Date/Time   COLORURINE YELLOW 10/02/2017 0910   APPEARANCEUR HAZY (A) 10/02/2017 0910   LABSPEC 1.010 10/02/2017 0910   PHURINE 8.0 10/02/2017 0910   GLUCOSEU NEGATIVE 10/02/2017 0910   HGBUR NEGATIVE 10/02/2017 0910   BILIRUBINUR NEGATIVE 10/02/2017 0910   BILIRUBINUR neg 04/02/2012 1618   KETONESUR NEGATIVE 10/02/2017 0910   PROTEINUR NEGATIVE 10/02/2017 0910   UROBILINOGEN 0.2 05/30/2014 1423   NITRITE NEGATIVE 10/02/2017 0910   LEUKOCYTESUR NEGATIVE 10/02/2017 0910   Sepsis Labs Invalid input(s): PROCALCITONIN,  WBC,  LACTICIDVEN Microbiology No results found for this or any previous visit (from the  past 240 hour(s)).   Time coordinating discharge: 35 minutes  SIGNED:   [name], MD  [facility] 10/03/2017, 12:16 PM Pager: [phone]  If 7PM-7AM, please contact night-coverage [email or web address] Password [email or web address]

## 2017-10-16 NOTE — Progress Notes (Signed)
Cardiology Office Note   Date:  10/17/2017   ID:  STACYE NOORI, DOB 1921/06/25, MRN [id number]  PCP:  [name], MD  Cardiologist: Dr. Martinique  Chief Complaint  Patient presents with  . Pre-op Exam  . Coronary Artery Disease     History of Present Illness: Amber Bender is a 84 y.o. female who presents posthospitalization for ongoing assessment and management of coronary artery disease, with history of MI in 1997 with placement to unknown artery, hypertension, dyslipidemia, history of chronic kidney disease stage III and pituitary adenoma status post TSS melanoma and osteoarthritis.  She was seen on consultation on 10/03/2017 by Dr. Mertie Moores during recent hospitalization for hypertensive urgency.  She was ruled out for ACS with negative EKG and negative troponin x5.  She was to continue medical therapy.  Due to patient's age no further ischemic testing was planned.  She comes today with no chest pain or dyspnea/ She attending water aerobics.   Past Medical History:  Diagnosis Date  . Allergy   . Arthritis   . Cancer ([place])   . Cataract   . CKD (chronic kidney disease), stage III ([place])   . History of heart artery stent   . HLD (hyperlipidemia)   . HTN (hypertension)   . Melanoma ([place])   . Myocardial infarction [facility])     Past Surgical History:  Procedure Laterality Date  . ABDOMINAL HYSTERECTOMY    . APPENDECTOMY    . BREAST SURGERY    . CARDIAC CATHETERIZATION    . EYE SURGERY    . PITUITARY SURGERY     Removal of tumor     Current Outpatient Medications  Medication Sig Dispense Refill  . Acetaminophen (TYLENOL ARTHRITIS PAIN PO) Take 1 tablet by mouth daily as needed (pain).     Marland Kitchen aspirin 81 MG tablet Take 81 mg by mouth at bedtime.     . calcium-vitamin D (OSCAL WITH D) 250-125 MG-UNIT per tablet Take 1 tablet by mouth daily.    . fluticasone (FLONASE) 50 MCG/ACT nasal spray Place 1 spray into both nostrils as needed for allergies or rhinitis.    Marland Kitchen  lisinopril-hydrochlorothiazide (PRINZIDE,ZESTORETIC) 20-12.5 MG per tablet Take 1 tablet by mouth daily.    . Multiple Vitamins-Minerals (PRESERVISION AREDS 2) CAPS Take 1 capsule by mouth 2 (two) times daily.    Marland Kitchen omeprazole (PRILOSEC) 20 MG capsule Take 1 capsule (20 mg total) by mouth daily. 14 capsule 0  . Propylene Glycol (SYSTANE BALANCE) 0.6 % SOLN Apply 1 drop to eye as needed (DRY EYES).    Marland Kitchen simvastatin (ZOCOR) 20 MG tablet Take 20 mg by mouth every evening.    . traMADol (ULTRAM) 50 MG tablet Take 1 tablet (50 mg total) by mouth every 6 (six) hours as needed for severe pain. 14 tablet 0   No current facility-administered medications for this visit.     Allergies:   Erythromycin; Niacin and related; Septra [sulfamethoxazole-trimethoprim]; Ciprofloxacin; and Sulfa antibiotics    Social History:  The patient  reports that she has never smoked. She has never used smokeless tobacco. She reports that she drinks alcohol. She reports that she does not use drugs.   Family History:  The patient's family history includes Cancer in her mother; Heart attack in her father.    ROS: All other systems are reviewed and negative. Unless otherwise mentioned in H&P    PHYSICAL EXAM: VS:  BP 120/62   Pulse 77   Ht 5' (1.524  m)   Wt 140 lb 9.6 oz (63.8 kg)   BMI 27.46 kg/m  , BMI Body mass index is 27.46 kg/m. GEN: Well nourished, well developed, in no acute distress Looking younger than stated age.  HEENT: normal  Neck: no JVD, carotid bruits, or masses Cardiac: RRR; no murmurs, rubs, or gallops,no edema  Respiratory:  Clear to auscultation bilaterally, normal work of breathing GI: soft, nontender, nondistended, + BS MS: no deformity or atrophy  Skin: warm and dry, no rash Neuro:  Strength and sensation are intact. Hard of hearing.  Psych: euthymic mood, full affect   EKG:  NSR with left axis deviation. Rate 77 bpm.  Recent Labs: 10/02/2017: ALT 11; BUN 16; Creatinine, Ser 1.06;  Hemoglobin 12.5; Platelets 247; Potassium 3.8; Sodium 137    Lipid Panel    Component Value Date/Time   CHOL 152 2017-10-10 0509   TRIG 210 (H) 10-Oct-2017 0509   HDL 37 (L) 10-Oct-2017 0509   CHOLHDL 4.1 2017-10-10 0509   VLDL 42 (H) 10/10/17 0509   LDLCALC 73 10-10-2017 0509      Wt Readings from Last 3 Encounters:  10/17/17 140 lb 9.6 oz (63.8 kg)  2017-10-10 139 lb 5.3 oz (63.2 kg)  04/28/17 148 lb (67.1 kg)      Other studies Reviewed: Echocardiogram Oct 10, 2017  Left ventricle: The cavity size was normal. Wall thickness was   normal. Systolic function was vigorous. The estimated ejection   fraction was in the range of 65% to 70%. Wall motion was normal;   there were no regional wall motion abnormalities. Doppler   parameters are consistent with abnormal left ventricular   relaxation (grade 1 diastolic dysfunction). Doppler parameters   are consistent with high ventricular filling pressure.  Impressions:  - Vigorous LV systolic function; mild diastolic dysfunction;   elevated LV filling pressure.   ASSESSMENT AND PLAN:  1. CAD: MI in 1997 with stenting to unknown coronary. She remains on ASA 81 mg. No cardiac complaints. She is stable from cardiac standpoint.    Primary Cardiologist: No primary care provider on file.  Chart reviewed as part of pre-operative protocol coverage. Given past medical history and time since last visit, based on ACC/AHA guidelines, Amber Bender would be at acceptable risk for the planned procedure knee injections without further cardiovascular testing.   2. Hypertension: BP is well controlled on lisinopril/HCTZ. Repeat labs have been reviewed. They are all WNL.   3. Osteoarthritis: Patient is ok to have knee injections per Dr. Maureen Ralphs.   Current medicines are reviewed at length with the patient today.    Labs/ tests ordered today include: None  Phill Myron. West Pugh, ANP, AACC   10/17/2017 12:41 PM    [facility]  [facility] [address] [place], [place] [address] Phone: [phone]; Fax: [phone]

## 2017-10-17 ENCOUNTER — Ambulatory Visit: Payer: Medicare Other | Admitting: Physician Assistant

## 2017-10-17 ENCOUNTER — Ambulatory Visit: Payer: Medicare Other | Admitting: Adult Health

## 2017-10-17 ENCOUNTER — Encounter: Payer: Self-pay | Admitting: Adult Health

## 2017-10-17 VITALS — BP 120/62 | HR 77 | Ht 60.0 in | Wt 140.6 lb

## 2017-10-17 DIAGNOSIS — I2510 Atherosclerotic heart disease of native coronary artery without angina pectoris: Secondary | ICD-10-CM

## 2017-10-17 DIAGNOSIS — R079 Chest pain, unspecified: Secondary | ICD-10-CM | POA: Diagnosis not present

## 2017-10-17 DIAGNOSIS — Z01810 Encounter for preprocedural cardiovascular examination: Secondary | ICD-10-CM | POA: Diagnosis not present

## 2017-10-17 DIAGNOSIS — I10 Essential (primary) hypertension: Secondary | ICD-10-CM | POA: Diagnosis not present

## 2017-10-17 NOTE — Patient Instructions (Signed)
If you need a refill on your cardiac medications before your next appointment, please call your pharmacy.  Special Instructions: OK FOR KNEE INJECTION  Follow-Up: Your physician wants you to follow-up in: 6 months  WITH DR Martinique You should receive a reminder letter in the mail two months in advance. If you do not receive a letter, please call our office 01-2018 to schedule the 04-2018 follow-up appointment.   Thank you for choosing [facility] at [facility]!!

## 2017-11-07 DIAGNOSIS — M79672 Pain in left foot: Secondary | ICD-10-CM | POA: Insufficient documentation

## 2018-01-08 DIAGNOSIS — M545 Low back pain, unspecified: Secondary | ICD-10-CM | POA: Insufficient documentation

## 2018-01-24 DIAGNOSIS — M5136 Other intervertebral disc degeneration, lumbar region: Secondary | ICD-10-CM | POA: Insufficient documentation

## 2018-04-02 ENCOUNTER — Encounter (HOSPITAL_COMMUNITY): Payer: Self-pay | Admitting: Emergency Medicine

## 2018-04-02 ENCOUNTER — Emergency Department (HOSPITAL_COMMUNITY): Payer: Medicare Other

## 2018-04-02 ENCOUNTER — Other Ambulatory Visit: Payer: Self-pay

## 2018-04-02 ENCOUNTER — Observation Stay (HOSPITAL_COMMUNITY)
Admission: EM | Admit: 2018-04-02 | Discharge: 2018-04-03 | Disposition: A | Discharge status: Home / Self Care | Payer: Medicare Other | Attending: Family Medicine | Admitting: Family Medicine

## 2018-04-02 DIAGNOSIS — I2510 Atherosclerotic heart disease of native coronary artery without angina pectoris: Secondary | ICD-10-CM | POA: Diagnosis not present

## 2018-04-02 DIAGNOSIS — R0789 Other chest pain: Secondary | ICD-10-CM | POA: Diagnosis not present

## 2018-04-02 DIAGNOSIS — N183 Chronic kidney disease, stage 3 unspecified: Secondary | ICD-10-CM | POA: Diagnosis present

## 2018-04-02 DIAGNOSIS — E785 Hyperlipidemia, unspecified: Secondary | ICD-10-CM | POA: Diagnosis present

## 2018-04-02 DIAGNOSIS — Z955 Presence of coronary angioplasty implant and graft: Secondary | ICD-10-CM | POA: Insufficient documentation

## 2018-04-02 DIAGNOSIS — R079 Chest pain, unspecified: Secondary | ICD-10-CM | POA: Diagnosis present

## 2018-04-02 DIAGNOSIS — Z85820 Personal history of malignant melanoma of skin: Secondary | ICD-10-CM | POA: Diagnosis not present

## 2018-04-02 DIAGNOSIS — Z66 Do not resuscitate: Secondary | ICD-10-CM | POA: Diagnosis not present

## 2018-04-02 DIAGNOSIS — I252 Old myocardial infarction: Secondary | ICD-10-CM

## 2018-04-02 DIAGNOSIS — Z79899 Other long term (current) drug therapy: Secondary | ICD-10-CM | POA: Insufficient documentation

## 2018-04-02 DIAGNOSIS — J302 Other seasonal allergic rhinitis: Secondary | ICD-10-CM | POA: Insufficient documentation

## 2018-04-02 DIAGNOSIS — Z7982 Long term (current) use of aspirin: Secondary | ICD-10-CM | POA: Insufficient documentation

## 2018-04-02 DIAGNOSIS — I129 Hypertensive chronic kidney disease with stage 1 through stage 4 chronic kidney disease, or unspecified chronic kidney disease: Secondary | ICD-10-CM | POA: Diagnosis not present

## 2018-04-02 DIAGNOSIS — M5432 Sciatica, left side: Secondary | ICD-10-CM | POA: Diagnosis not present

## 2018-04-02 DIAGNOSIS — R9431 Abnormal electrocardiogram [ECG] [EKG]: Secondary | ICD-10-CM | POA: Insufficient documentation

## 2018-04-02 DIAGNOSIS — E871 Hypo-osmolality and hyponatremia: Secondary | ICD-10-CM | POA: Diagnosis present

## 2018-04-02 DIAGNOSIS — R350 Frequency of micturition: Secondary | ICD-10-CM | POA: Diagnosis present

## 2018-04-02 DIAGNOSIS — I10 Essential (primary) hypertension: Secondary | ICD-10-CM | POA: Diagnosis present

## 2018-04-02 LAB — BASIC METABOLIC PANEL
Anion gap: 9 (ref 5–15)
BUN: 20 mg/dL (ref 8–23)
CHLORIDE: 95 mmol/L — AB (ref 98–111)
CO2: 28 mmol/L (ref 22–32)
CREATININE: 1.03 mg/dL — AB (ref 0.44–1.00)
Calcium: 10 mg/dL (ref 8.9–10.3)
GFR calc Af Amer: 52 mL/min — ABNORMAL LOW (ref >60)
GFR calc non Af Amer: 45 mL/min — ABNORMAL LOW (ref >60)
GLUCOSE: 100 mg/dL — AB (ref 70–99)
Potassium: 3.8 mmol/L (ref 3.5–5.1)
SODIUM: 132 mmol/L — AB (ref 135–145)

## 2018-04-02 LAB — I-STAT TROPONIN, ED: Troponin i, poc: 0 ng/mL (ref 0.00–0.08)

## 2018-04-02 LAB — TROPONIN I: Troponin I: <0.03 ng/mL (ref <0.03)

## 2018-04-02 LAB — CBC
HEMATOCRIT: 38.2 % (ref 36.0–46.0)
Hemoglobin: 13 g/dL (ref 12.0–15.0)
MCH: 30.4 pg (ref 26.0–34.0)
MCHC: 34 g/dL (ref 30.0–36.0)
MCV: 89.3 fL (ref 78.0–100.0)
Platelets: 321 10*3/uL (ref 150–400)
RBC: 4.28 MIL/uL (ref 3.87–5.11)
RDW: 13.6 % (ref 11.5–15.5)
WBC: 8.1 10*3/uL (ref 4.0–10.5)

## 2018-04-02 LAB — TSH: TSH: 1.408 u[IU]/mL (ref 0.350–4.500)

## 2018-04-02 MED ORDER — ALBUTEROL SULFATE (2.5 MG/3ML) 0.083% IN NEBU: 2.5000 mg | INHALATION_SOLUTION | Freq: Four times a day (QID) | RESPIRATORY_TRACT | Status: DC | PRN

## 2018-04-02 MED ORDER — POLYVINYL ALCOHOL-POVIDONE PF 1.4-0.6 % OP SOLN: 2.0000 [drp] | OPHTHALMIC | Status: DC | PRN

## 2018-04-02 MED ORDER — LISINOPRIL 20 MG PO TABS
20.0000 mg | ORAL_TABLET | Freq: Every day | ORAL | Status: DC
  Administered 2018-04-03: 20 mg via ORAL
  Filled 2018-04-02: qty 1

## 2018-04-02 MED ORDER — LISINOPRIL-HYDROCHLOROTHIAZIDE 20-12.5 MG PO TABS: 1.0000 | ORAL_TABLET | Freq: Every day | ORAL | Status: DC

## 2018-04-02 MED ORDER — SODIUM CHLORIDE 0.9 % IV SOLN
Freq: Once | INTRAVENOUS | Status: AC
  Administered 2018-04-02: 23:00:00 via INTRAVENOUS

## 2018-04-02 MED ORDER — HYDRALAZINE HCL 20 MG/ML IJ SOLN: 5.0000 mg | INTRAMUSCULAR | Status: DC | PRN

## 2018-04-02 MED ORDER — LORATADINE 10 MG PO TABS
10.0000 mg | ORAL_TABLET | Freq: Every day | ORAL | Status: DC
  Administered 2018-04-03: 10 mg via ORAL
  Filled 2018-04-02: qty 1

## 2018-04-02 MED ORDER — MORPHINE SULFATE (PF) 2 MG/ML IV SOLN: 2.0000 mg | INTRAVENOUS | Status: DC | PRN

## 2018-04-02 MED ORDER — NITROGLYCERIN 0.4 MG SL SUBL: 0.4000 mg | SUBLINGUAL_TABLET | SUBLINGUAL | Status: DC | PRN

## 2018-04-02 MED ORDER — DICLOFENAC SODIUM 1 % TD GEL
2.0000 g | Freq: Every day | TRANSDERMAL | Status: DC | PRN
  Filled 2018-04-02: qty 100

## 2018-04-02 MED ORDER — SIMVASTATIN 20 MG PO TABS
20.0000 mg | ORAL_TABLET | Freq: Every evening | ORAL | Status: DC
  Administered 2018-04-02: 20 mg via ORAL
  Filled 2018-04-02: qty 1

## 2018-04-02 MED ORDER — ENOXAPARIN SODIUM 40 MG/0.4ML ~~LOC~~ SOLN
40.0000 mg | SUBCUTANEOUS | Status: DC
  Administered 2018-04-02: 40 mg via SUBCUTANEOUS
  Filled 2018-04-02: qty 0.4

## 2018-04-02 MED ORDER — INFLUENZA VAC SPLIT HIGH-DOSE 0.5 ML IM SUSY
0.5000 mL | PREFILLED_SYRINGE | INTRAMUSCULAR | Status: DC
  Filled 2018-04-02: qty 0.5

## 2018-04-02 MED ORDER — GI COCKTAIL ~~LOC~~: 30.0000 mL | Freq: Four times a day (QID) | ORAL | Status: DC | PRN

## 2018-04-02 MED ORDER — PANTOPRAZOLE SODIUM 40 MG PO TBEC: 40.0000 mg | DELAYED_RELEASE_TABLET | Freq: Every day | ORAL | Status: DC | PRN

## 2018-04-02 MED ORDER — ASPIRIN 81 MG PO CHEW: 81.0000 mg | CHEWABLE_TABLET | Freq: Every day | ORAL | Status: DC

## 2018-04-02 MED ORDER — TRAMADOL HCL 50 MG PO TABS
50.0000 mg | ORAL_TABLET | Freq: Four times a day (QID) | ORAL | Status: DC | PRN
  Administered 2018-04-03: 50 mg via ORAL
  Filled 2018-04-02: qty 1

## 2018-04-02 MED ORDER — ASPIRIN 81 MG PO CHEW
324.0000 mg | CHEWABLE_TABLET | Freq: Once | ORAL | Status: AC
  Administered 2018-04-02: 324 mg via ORAL
  Filled 2018-04-02: qty 4

## 2018-04-02 MED ORDER — ACETAMINOPHEN 325 MG PO TABS
650.0000 mg | ORAL_TABLET | ORAL | Status: DC | PRN
  Administered 2018-04-02: 650 mg via ORAL
  Filled 2018-04-02: qty 2

## 2018-04-02 MED ORDER — POLYVINYL ALCOHOL 1.4 % OP SOLN
2.0000 [drp] | OPHTHALMIC | Status: DC | PRN
  Filled 2018-04-02: qty 15

## 2018-04-02 MED ORDER — GUAIFENESIN ER 600 MG PO TB12
600.0000 mg | ORAL_TABLET | Freq: Two times a day (BID) | ORAL | Status: DC
  Administered 2018-04-02 – 2018-04-03 (×2): 600 mg via ORAL
  Filled 2018-04-02 (×2): qty 1

## 2018-04-02 MED ORDER — HYDROCHLOROTHIAZIDE 12.5 MG PO CAPS
12.5000 mg | ORAL_CAPSULE | Freq: Every day | ORAL | Status: DC
  Administered 2018-04-03: 12.5 mg via ORAL
  Filled 2018-04-02: qty 1

## 2018-04-02 MED ORDER — ONDANSETRON HCL 4 MG/2ML IJ SOLN
4.0000 mg | Freq: Four times a day (QID) | INTRAMUSCULAR | Status: DC | PRN
  Filled 2018-04-02 (×2): qty 2

## 2018-04-02 NOTE — ED Notes (Signed)
ED TO INPATIENT HANDOFF REPORT  Name/Age/Gender Amber Bender 84 y.o. female  Code Status    Code Status Orders  (From admission, onward)         Start     Ordered   04/02/18 2024  Do not attempt resuscitation (DNR)  Continuous    Question Answer Comment  In the event of cardiac or respiratory ARREST Do not call a "code blue"   In the event of cardiac or respiratory ARREST Do not perform Intubation, CPR, defibrillation or ACLS   In the event of cardiac or respiratory ARREST Use medication by any route, position, wound care, and other measures to relive pain and suffering. May use oxygen, suction and manual treatment of airway obstruction as needed for comfort.      04/02/18 2025        Code Status History    Date Active Date Inactive Code Status Order ID Comments User Context   10/02/2017 1306 10/03/2017 1941 DNR [id number]  Karmen Bongo, MD ED   10/02/2017 1135 10/02/2017 1306 Full Code [id number]  Samella Parr, NP ED      Home/SNF/Other Home  Chief Complaint chest pains/heart issues/weakness  Level of Care/Admitting Diagnosis ED Disposition    ED Disposition Condition Comment   [facility]: [facility] [[id number]]  Level of Care: Telemetry [5]  Admit to tele based on following criteria: Complex arrhythmia (Bradycardia/Tachycardia)  Diagnosis: Chest pain [163846]  Admitting Physician: Norval Morton [[id number]]  Attending Physician: Norval Morton [[id number]]  PT Class (Do Not Modify): Observation [104]  PT Acc Code (Do Not Modify): Observation [10022]       Medical History Past Medical History:  Diagnosis Date  . Allergy   . Arthritis   . Cancer ([place])   . Cataract   . CKD (chronic kidney disease), stage III ([place])   . History of heart artery stent   . HLD (hyperlipidemia)   . HTN (hypertension)   . Melanoma ([place])   . Myocardial infarction (HCC)     Allergies Allergies  Allergen Reactions  . Erythromycin Other (See  Comments)    Stomach cramps  . Niacin And Related Hives  . Ciprofloxacin Rash  . Septra [Sulfamethoxazole-Trimethoprim] Rash  . Sulfa Antibiotics Rash    IV Location/Drains/Wounds Patient Lines/Drains/Airways Status   Active Line/Drains/Airways    Name:   Placement date:   Placement time:   Site:   Days:   Peripheral IV 04/02/18 Left Antecubital   04/02/18    1749    Antecubital   less than 1          Labs/Imaging Results for orders placed or performed during the hospital encounter of 04/02/18 (from the past 48 hour(s))  Basic metabolic panel     Status: Abnormal   Collection Time: 04/02/18  4:23 PM  Result Value Ref Range   Sodium 132 (L) 135 - 145 mmol/L   Potassium 3.8 3.5 - 5.1 mmol/L   Chloride 95 (L) 98 - 111 mmol/L   CO2 28 22 - 32 mmol/L   Glucose, Bld 100 (H) 70 - 99 mg/dL   BUN 20 8 - 23 mg/dL   Creatinine, Ser 1.03 (H) 0.44 - 1.00 mg/dL   Calcium 10.0 8.9 - 10.3 mg/dL   GFR calc non Af Amer 45 (L) >60 mL/min   GFR calc Af Amer 52 (L) >60 mL/min    Comment: (NOTE) The eGFR has been calculated using the CKD EPI equation.  This calculation has not been validated in all clinical situations. eGFR's persistently <60 mL/min signify possible Chronic Kidney Disease.    Anion gap 9 5 - 15    Comment: Performed at [facility], [place] [address], [place], [place] [address]  CBC     Status: None   Collection Time: 04/02/18  4:23 PM  Result Value Ref Range   WBC 8.1 4.0 - 10.5 K/uL   RBC 4.28 3.87 - 5.11 MIL/uL   Hemoglobin 13.0 12.0 - 15.0 g/dL   HCT 38.2 36.0 - 46.0 %   MCV 89.3 78.0 - 100.0 fL   MCH 30.4 26.0 - 34.0 pg   MCHC 34.0 30.0 - 36.0 g/dL   RDW 13.6 11.5 - 15.5 %   Platelets 321 150 - 400 K/uL    Comment: Performed at [facility], [place] [address] [place], [place] [address]  I-stat troponin, ED     Status: None   Collection Time: 04/02/18  5:57 PM  Result Value Ref Range   Troponin i, poc 0.00 0.00 - 0.08 ng/mL    Comment 3            Comment: Due to the release kinetics of cTnI, a negative result within the first hours of the onset of symptoms does not rule out myocardial infarction with certainty. If myocardial infarction is still suspected, repeat the test at appropriate intervals.    Dg Chest 2 View  Result Date: 04/02/2018 CLINICAL DATA:  Cough, weakness and LEFT chest pain for a few days. Shortness of breath. History of melanoma. Nonsmoker. EXAM: CHEST - 2 VIEW COMPARISON:  Chest radiograph October 02, 2017 FINDINGS: Cardiomediastinal silhouette is normal. Tortuous aorta, calcified aortic arch. No pleural effusions or focal consolidations. Trachea projects midline and there is no pneumothorax. Soft tissue planes and included osseous structures are non-suspicious. Thoracolumbar dextroscoliosis. IMPRESSION: 1. No acute cardiopulmonary process. 2.  Aortic Atherosclerosis (ICD10-I70.0). Electronically Signed   By: Elon Alas M.D.   On: 04/02/2018 18:25    Pending Labs Unresulted Labs (From admission, onward)    Start     Ordered   04/03/18 0500  Lipid panel  Tomorrow morning,   R     04/02/18 2025   04/03/18 0500  CBC with Differential/Platelet  Tomorrow morning,   R     04/02/18 2025   04/03/18 0981  Basic metabolic panel  Tomorrow morning,   R     04/02/18 2025   04/02/18 2021  Troponin I-serum (0, 3, 6 hours)  Now then every 3 hours,   TIMED     04/02/18 2025          Vitals/Pain Today's Vitals   04/02/18 1745 04/02/18 1904 04/02/18 1954 04/02/18 2015  BP: (!) 166/73 (!) 176/75 (!) 144/93 (!) 185/71  Pulse: 71 68 66 75  Resp: 13 13 20 15   Temp:      TempSrc:      SpO2: 97% 99% 92% 97%  PainSc:        Isolation Precautions No active isolations  Medications Medications  aspirin chewable tablet 81 mg (has no administration in time range)  simvastatin (ZOCOR) tablet 20 mg (has no administration in time range)  pantoprazole (PROTONIX) EC tablet 40 mg (has no administration  in time range)  diclofenac sodium (VOLTAREN) 1 % transdermal gel 2 g (has no administration in time range)  acetaminophen (TYLENOL) tablet 650 mg (has no administration in time range)  ondansetron (ZOFRAN) injection  4 mg (has no administration in time range)  0.9 %  sodium chloride infusion (has no administration in time range)  enoxaparin (LOVENOX) injection 40 mg (has no administration in time range)  morphine 2 MG/ML injection 2 mg (has no administration in time range)  gi cocktail (Maalox,Lidocaine,Donnatal) (has no administration in time range)  nitroGLYCERIN (NITROSTAT) SL tablet 0.4 mg (has no administration in time range)  albuterol (PROVENTIL) (2.5 MG/3ML) 0.083% nebulizer solution 2.5 mg (has no administration in time range)  lisinopril (PRINIVIL,ZESTRIL) tablet 20 mg (has no administration in time range)    And  hydrochlorothiazide (MICROZIDE) capsule 12.5 mg (has no administration in time range)  polyvinyl alcohol (LIQUIFILM TEARS) 1.4 % ophthalmic solution 2 drop (has no administration in time range)  aspirin chewable tablet 324 mg (324 mg Oral Given 04/02/18 1954)    Mobility walks with person assist

## 2018-04-02 NOTE — ED Triage Notes (Signed)
Pt reports that has been feeling bad for couple days and her BP was elevated and has heart stent and called her doctor who advised pt to get to ED. Pt also c/o cough, weakness, sciatica and left side chest pains and SOB.

## 2018-04-02 NOTE — ED Notes (Signed)
Gave report to [place], Therapist, sports for room [phone]

## 2018-04-02 NOTE — H&P (Addendum)
History and Physical    Amber Bender:[id number] DOB: [birth year] DOA: 04/02/2018  Referring MD/NP/PA: [name], MD PCP: Amber Bunting, MD  Patient coming from: Home  Chief Complaint: Weakness  I have personally briefly reviewed patient's old medical records in [place]   HPI: Amber Bender is a 84 y.o. female with medical history significant of HTN, HLD, CAD s/p stent in 1997, CKD stage III, and sciatica; who presents with complaints of weakness and shortness of breath since this morning.  Patient reports waking up around 3 AM with pain running down the left leg which she relates to her sciatica.  She describes it as the worst pain ever.  Patient took 2 pills Tylenol arthritis with only mild relief.  She reports having a injection by Dr. Herma Bender of orthopedics more than 4 weeks ago and, then another injection about 1-2 weeks ago which have not helped.  She also reported feeling weak, dizzy, and more short of breath than usual.  She felt as though she were unable to take deep breaths.  Reports having similar symptoms with previous heart attack back in 1996.  Furthermore, complained left arm/shoulder pain and left lateral chest discomfort.  These symptoms were intermittent and could last 30 minutes or so before self resolving.  Other associated symptoms include complaints of urinary frequency, and cough with sputum production.  Denies having any fever, chills, falls, nausea, vomiting, diarrhea, or leg swelling.  ED Course: Upon admission into the emergency department patient was noted to be afebrile, blood pressure 144/93-176/75, and all other vital signs maintained.  Labs revealed normal CBC, sodium 132, chloride 95, BUN 20, creatinine 1.03, troponin 0.  Chest x-ray showed no acute abnormality and aortic atherosclerotic disease.  EKG showed no significant ischemic changes.  Patient reports being chest pain-free at this time and was given 325 mg of aspirin.  TRH called to admit for  chest pain rule out.   Review of Systems  Constitutional: Positive for malaise/fatigue. Negative for chills and fever.  HENT: Positive for congestion. Negative for ear discharge.   Eyes: Negative for photophobia and pain.  Respiratory: Positive for cough, sputum production and shortness of breath.   Cardiovascular: Positive for chest pain. Negative for leg swelling.  Gastrointestinal: Negative for abdominal pain, blood in stool, nausea and vomiting.  Genitourinary: Positive for frequency. Negative for dysuria.  Musculoskeletal: Positive for back pain and myalgias.  Skin: Negative for itching and rash.  Neurological: Positive for dizziness and weakness. Negative for focal weakness.  Endo/Heme/Allergies: Positive for environmental allergies.  Psychiatric/Behavioral: Negative for memory loss and substance abuse.    Past Medical History:  Diagnosis Date  . Allergy   . Arthritis   . Cancer ([place])   . Cataract   . CKD (chronic kidney disease), stage III ([place])   . History of heart artery stent   . HLD (hyperlipidemia)   . HTN (hypertension)   . Melanoma ([place])   . Myocardial infarction [facility])     Past Surgical History:  Procedure Laterality Date  . ABDOMINAL HYSTERECTOMY    . APPENDECTOMY    . BREAST SURGERY    . CARDIAC CATHETERIZATION    . EYE SURGERY    . PITUITARY SURGERY     Removal of tumor     reports that she has never smoked. She has never used smokeless tobacco. She reports that she drinks alcohol. She reports that she does not use drugs.  Allergies  Allergen Reactions  . Erythromycin  Other (See Comments)    Stomach cramps  . Niacin And Related Hives  . Ciprofloxacin Rash  . Septra [Sulfamethoxazole-Trimethoprim] Rash  . Sulfa Antibiotics Rash    Family History  Problem Relation Age of Onset  . Cancer Mother   . Heart attack Father        died at age 54     Prior to Admission medications   Medication Sig Start Date End Date Taking? Authorizing Provider    Acetaminophen (TYLENOL ARTHRITIS PAIN PO) Take 2 tablets by mouth daily as needed (pain).    Yes [provider]  aspirin 81 MG tablet Take 81 mg by mouth at bedtime.    Yes [provider]  cholecalciferol (VITAMIN D) 1000 units tablet Take 1,000 Units by mouth daily.   Yes [provider]  diclofenac sodium (VOLTAREN) 1 % GEL Apply 2 g topically daily as needed (pain).    Yes [provider]  lisinopril-hydrochlorothiazide (PRINZIDE,ZESTORETIC) 20-12.5 MG per tablet Take 1 tablet by mouth daily.   Yes [provider]  Multiple Vitamins-Minerals (PRESERVISION AREDS 2) CAPS Take 1 capsule by mouth 2 (two) times daily.   Yes [provider]  omeprazole (PRILOSEC) 20 MG capsule Take 1 capsule (20 mg total) by mouth daily. Patient taking differently: Take 20 mg by mouth daily as needed (acid reflux).  05/30/14  Yes Bender, Amber Fee, MD  Polyvinyl Alcohol-Povidone (REFRESH OP) Place 2 drops into both eyes daily as needed (dry eyes).   Yes [provider]  simvastatin (ZOCOR) 20 MG tablet Take 20 mg by mouth every evening.   Yes [provider]  traMADol (ULTRAM) 50 MG tablet Take 1 tablet (50 mg total) by mouth every 6 (six) hours as needed for severe pain. Patient not taking: Reported on 04/02/2018 10/03/17   Amber August, MD    Physical Exam:  Constitutional: Elderly female who appears younger than stated age in NAD, calm, comfortable Vitals:   04/02/18 1623 04/02/18 1745 04/02/18 1904 04/02/18 1954  BP:  (!) 166/73 (!) 176/75 (!) 144/93  Pulse: 67 71 68 66  Resp:  13 13 20   Temp: 98.1 F (36.7 C)     TempSrc: Oral     SpO2: 100% 97% 99% 92%   Eyes: PERRL, lids and conjunctivae normal ENMT: Mucous membranes are moist.  Pale boggy bilateral nasal turbinates with mucus present of the posterior oropharynx. Neck: normal, supple, no masses, no thyromegaly Respiratory: clear to auscultation bilaterally, no wheezing, no  crackles. Normal respiratory effort. No accessory muscle use.  Cardiovascular: Regular rate and rhythm, no murmurs / rubs / gallops. No extremity edema. 2+ pedal pulses. No carotid bruits.  Chest pain not reproducible Abdomen: no tenderness, no masses palpated. No hepatosplenomegaly. Bowel sounds positive.  Musculoskeletal: no clubbing / cyanosis. No joint deformity upper and lower extremities.  Tenderness palpation of the bilateral lower extremities. Skin: no rashes, lesions, ulcers. No induration Neurologic: CN 2-12 grossly intact. Sensation intact, DTR normal. Strength 5/5 in all 4.  Psychiatric: Normal judgment and insight. Alert and oriented x 3. Normal mood.     Labs on Admission: I have personally reviewed following labs and imaging studies  CBC: Recent Labs  Lab 04/02/18 1623  WBC 8.1  HGB 13.0  HCT 38.2  MCV 89.3  PLT 063   Basic Metabolic Panel: Recent Labs  Lab 04/02/18 1623  NA 132*  K 3.8  CL 95*  CO2 28  GLUCOSE 100*  BUN 20  CREATININE 1.03*  CALCIUM 10.0   GFR: CrCl cannot be calculated (Unknown ideal weight.). Liver Function Tests: No results for input(s): AST, ALT, ALKPHOS, BILITOT, PROT, ALBUMIN in the last 168 hours. No results for input(s): LIPASE, AMYLASE in the last 168 hours. No results for input(s): AMMONIA in the last 168 hours. Coagulation Profile: No results for input(s): INR, PROTIME in the last 168 hours. Cardiac Enzymes: No results for input(s): CKTOTAL, CKMB, CKMBINDEX, TROPONINI in the last 168 hours. BNP (last 3 results) No results for input(s): PROBNP in the last 8760 hours. HbA1C: No results for input(s): HGBA1C in the last 72 hours. CBG: No results for input(s): GLUCAP in the last 168 hours. Lipid Profile: No results for input(s): CHOL, HDL, LDLCALC, TRIG, CHOLHDL, LDLDIRECT in the last 72 hours. Thyroid Function Tests: No results for input(s): TSH, T4TOTAL, FREET4, T3FREE, THYROIDAB in the last 72 hours. Anemia Panel: No  results for input(s): VITAMINB12, FOLATE, FERRITIN, TIBC, IRON, RETICCTPCT in the last 72 hours. Urine analysis:    Component Value Date/Time   COLORURINE YELLOW 10/02/2017 0910   APPEARANCEUR HAZY (A) 10/02/2017 0910   LABSPEC 1.010 10/02/2017 0910   PHURINE 8.0 10/02/2017 0910   GLUCOSEU NEGATIVE 10/02/2017 0910   HGBUR NEGATIVE 10/02/2017 0910   BILIRUBINUR NEGATIVE 10/02/2017 0910   BILIRUBINUR neg 04/02/2012 1618   KETONESUR NEGATIVE 10/02/2017 0910   PROTEINUR NEGATIVE 10/02/2017 0910   UROBILINOGEN 0.2 05/30/2014 1423   NITRITE NEGATIVE 10/02/2017 0910   LEUKOCYTESUR NEGATIVE 10/02/2017 0910   Sepsis Labs: No results found for this or any previous visit (from the past 240 hour(s)).   Radiological Exams on Admission: Dg Chest 2 View  Result Date: 04/02/2018 CLINICAL DATA:  Cough, weakness and LEFT chest pain for a few days. Shortness of breath. History of melanoma. Nonsmoker. EXAM: CHEST - 2 VIEW COMPARISON:  Chest radiograph October 02, 2017 FINDINGS: Cardiomediastinal silhouette is normal. Tortuous aorta, calcified aortic arch. No pleural effusions or focal consolidations. Trachea projects midline and there is no pneumothorax. Soft tissue planes and included osseous structures are non-suspicious. Thoracolumbar dextroscoliosis. IMPRESSION: 1. No acute cardiopulmonary process. 2.  Aortic Atherosclerosis (ICD10-I70.0). Electronically Signed   By: Elon Alas M.D.   On: 04/02/2018 18:25    EKG: Independently reviewed.  Appears to be more likely sinus rhythm with background artifact over atrial fibrillation.  Assessment/Plan Chest pain, CAD: Acute.  Patient presents with complaints of chest pain, left arm pain, dizziness, weakness, and shortness of breath.  Reports similar symptoms with previous heart attack requiring stent in 1997.  Initial troponin 0, and ekg  chest x-ray with no acute abnormalities.  Last echocardiogram noted EF of 65 to 70% with grade 1 diastolic dysfunction  in 07/6107. Heart score = 5-6 - Admit to a telemetry bed - Trend cardiac troponins every 3 hours x3 - Check lipid panel in a.m. - Message sent for cardiology to eval in a.m. for possible need of stress test  Abnormal EKG: Acute.  Initial EKG appears to be more likely sinus rhythm over atrial fibrillation. - Follow-up telemetry overnight  Essential hypertension: On admission blood pressures noted to be elevated up to185/71. - Continue lisinopril-hydrochlorothiazide - Hydralazine 5 mg IV prn sBP>180  Seasonal allergies: Patient noted to have pale boggy nasal turbinates.  Suspect allergies as a likely cause of cough. - Claritin - Mucinex  Urinary frequency: Patient complains of urinary frequency.  Question possibility of urinary tract infection. - Check urinalysis   Chronic kidney disease stage III: Chronic.  On admission creatinine  1.03 and BUN 20.  Which appears near patient's baseline. - Normal saline at 75 mL overnight while n.p.o.  Hyponatremia: Acute on chronic.  Initial sodium 132 on admission.  Suspect secondary to reticulocyte's and some mild dehydration. - Recheck BMP in a.m.  Hyperlipidemia: Last lipid profile noted total cholesterol 152, HDL 37, LDL 73, and triglycerides 210 on 10/03/2017. - Follow-up repeat lipid profile - Continue simvastatin   DVT prophylaxis: Lovenox Code Status: DNR Family Communication: none  Disposition Plan: Likely discharge home if work-up negative Consults called: none  Admission status:observation  Norval Morton MD [facility] Pager [phone]   If 7PM-7AM, please contact night-coverage [email or web address] Password [email or web address]  04/02/2018, 8:12 PM    \

## 2018-04-02 NOTE — ED Provider Notes (Signed)
[facility] Emergency Department Provider Note MRN:  [id number]  Arrival date & time: 04/03/18     Chief Complaint   Chest pain History of Present Illness   Amber Bender is a 84 y.o. year-old female with a history of CAD status post stent placement, CKD presenting to the ED with chief complaint of chest pain.  Pain is located in the left chest, at times rating to the left arm.  Pain began this morning.  Sudden onset, severe, intermittent.  Associated with shortness of breath, unable to take deep breaths.  Also associated with lightheadedness.  Denies nausea or vomiting, no abdominal pain.  Sent here by PCP out of concern given her history of CAD.  Patient also endorsing chronic left sciatica pain, which is unchanged.  No bowel or bladder dysfunction, no numbness weakness of the arms or legs.  Review of Systems  A complete 10 system review of systems was obtained and all systems are negative except as noted in the HPI and PMH.   Patient's Health History    Past Medical History:  Diagnosis Date  . Allergy   . Arthritis   . Cancer ([place])   . Cataract   . CKD (chronic kidney disease), stage III ([place])   . History of heart artery stent   . HLD (hyperlipidemia)   . HTN (hypertension)   . Melanoma ([place])   . Myocardial infarction [facility])     Past Surgical History:  Procedure Laterality Date  . ABDOMINAL HYSTERECTOMY    . APPENDECTOMY    . BREAST SURGERY    . CARDIAC CATHETERIZATION    . EYE SURGERY    . PITUITARY SURGERY     Removal of tumor    Family History  Problem Relation Age of Onset  . Cancer Mother   . Heart attack Father        died at age 41     Social History   Socioeconomic History  . Marital status: Widowed    Spouse name: Not on file  . Number of children: Not on file  . Years of education: Not on file  . Highest education level: Not on file  Occupational History  . Not on file  Social Needs  . Financial resource strain: Not hard at  all  . Food insecurity:    Worry: Patient refused    Inability: Patient refused  . Transportation needs:    Medical: Patient refused    Non-medical: Patient refused  Tobacco Use  . Smoking status: Never Smoker  . Smokeless tobacco: Never Used  Substance and Sexual Activity  . Alcohol use: Yes    Alcohol/week: 0.0 standard drinks    Comment: occasional  . Drug use: No  . Sexual activity: Not Currently    Comment: widow  Lifestyle  . Physical activity:    Days per week: Patient refused    Minutes per session: Patient refused  . Stress: Not at all  Relationships  . Social connections:    Talks on phone: Twice a week    Gets together: Twice a week    Attends religious service: 1 to 4 times per year    Active member of club or organization: Yes    Attends meetings of clubs or organizations: 1 to 4 times per year    Relationship status: Widowed  . Intimate partner violence:    Fear of current or ex partner: Patient refused    Emotionally abused: Patient refused  Physically abused: Patient refused    Forced sexual activity: Patient refused  Other Topics Concern  . Not on file  Social History Narrative  . Not on file     Physical Exam  Vital Signs and Nursing Notes reviewed Vitals:   04/02/18 2117 04/02/18 2147  BP: (!) 156/53 (!) 156/53  Pulse: 64 64  Resp:  16  Temp:  98 F (36.7 C)  SpO2:      CONSTITUTIONAL: Well-appearing, NAD NEURO:  Alert and oriented x 3, no focal deficits EYES:  eyes equal and reactive ENT/NECK:  no LAD, no JVD CARDIO: Regular rate, well-perfused, normal S1 and S2 PULM:  CTAB no wheezing or rhonchi GI/GU:  normal bowel sounds, non-distended, non-tender MSK/SPINE:  No gross deformities, no edema SKIN:  no rash, atraumatic PSYCH:  Appropriate speech and behavior  Diagnostic and Interventional Summary    EKG Interpretation  Date/Time:  Monday April 02 2018 16:19:06 EDT Ventricular Rate:  67 PR Interval:    QRS Duration: 91 QT  Interval:  403 QTC Calculation: 432 R Axis:   -8 Text Interpretation:  Atrial fibrillation RSR' in V1 or V2, right VCD or RVH Confirmed by [name] [phone]) on 04/02/2018 5:19:50 PM      Labs Reviewed  BASIC METABOLIC PANEL - Abnormal; Notable for the following components:      Result Value   Sodium 132 (*)    Chloride 95 (*)    Glucose, Bld 100 (*)    Creatinine, Ser 1.03 (*)    GFR calc non Af Amer 45 (*)    GFR calc Af Amer 52 (*)    All other components within normal limits  URINALYSIS, ROUTINE W REFLEX MICROSCOPIC - Abnormal; Notable for the following components:   Color, Urine STRAW (*)    All other components within normal limits  CBC  TROPONIN I  TSH  TROPONIN I  TROPONIN I  LIPID PANEL  CBC WITH DIFFERENTIAL/PLATELET  BASIC METABOLIC PANEL  I-STAT TROPONIN, ED    DG Chest 2 View  Final Result      Medications  aspirin chewable tablet 81 mg (has no administration in time range)  simvastatin (ZOCOR) tablet 20 mg (20 mg Oral Given 04/02/18 2231)  pantoprazole (PROTONIX) EC tablet 40 mg (has no administration in time range)  diclofenac sodium (VOLTAREN) 1 % transdermal gel 2 g (has no administration in time range)  acetaminophen (TYLENOL) tablet 650 mg (650 mg Oral Given 04/02/18 2351)  ondansetron (ZOFRAN) injection 4 mg (has no administration in time range)  enoxaparin (LOVENOX) injection 40 mg (40 mg Subcutaneous Given 04/02/18 2231)  morphine 2 MG/ML injection 2 mg (has no administration in time range)  gi cocktail (Maalox,Lidocaine,Donnatal) (has no administration in time range)  nitroGLYCERIN (NITROSTAT) SL tablet 0.4 mg (has no administration in time range)  albuterol (PROVENTIL) (2.5 MG/3ML) 0.083% nebulizer solution 2.5 mg (has no administration in time range)  lisinopril (PRINIVIL,ZESTRIL) tablet 20 mg (has no administration in time range)    And  hydrochlorothiazide (MICROZIDE) capsule 12.5 mg (has no administration in time range)  polyvinyl alcohol  (LIQUIFILM TEARS) 1.4 % ophthalmic solution 2 drop (has no administration in time range)  traMADol (ULTRAM) tablet 50 mg (has no administration in time range)  hydrALAZINE (APRESOLINE) injection 5 mg (has no administration in time range)  guaiFENesin (MUCINEX) 12 hr tablet 600 mg (600 mg Oral Given 04/02/18 2231)  loratadine (CLARITIN) tablet 10 mg (10 mg Oral Not Given 04/02/18 2205)  Influenza  vac split quadrivalent PF (FLUZONE HIGH-DOSE) injection 0.5 mL (has no administration in time range)  aspirin chewable tablet 324 mg (324 mg Oral Given 04/02/18 1954)  0.9 %  sodium chloride infusion ( Intravenous New Bag/Given 04/02/18 2231)     Procedures Critical Care  ED Course and Medical Decision Making  I have reviewed the triage vital signs and the nursing notes.  Pertinent labs & imaging results that were available during my care of the patient were reviewed by me and considered in my medical decision making (see below for details).    ACS versus PE in this 84 year old female with history of CAD.  Work-up pending.  Not tachycardic, satting 100% on room air, no evidence of DVT on exam, no history of DVT or PE, little to no concern at this time.  Work-up negative, admitted to hospitalist service for cardiac ischemic evaluation given the concern for underlying cardiac etiology.  Barth Kirks. Sedonia Small, MD [place] [email or web address]  Final Clinical Impressions(s) / ED Diagnoses     ICD-10-CM   1. Chest pain, unspecified type R07.9     ED Discharge Orders    None         Maudie Flakes, MD 04/03/18 [phone]

## 2018-04-02 NOTE — ED Notes (Signed)
Provided patient a ham sandwich, saltine crackers, and water. Permission given from Dr. Viona Gilmore.

## 2018-04-02 NOTE — ED Notes (Signed)
Assisted patient to the restroom via wheelchair and back to stretcher.

## 2018-04-03 ENCOUNTER — Encounter (HOSPITAL_COMMUNITY): Payer: Self-pay | Admitting: Physician Assistant

## 2018-04-03 DIAGNOSIS — N183 Chronic kidney disease, stage 3 (moderate): Secondary | ICD-10-CM | POA: Diagnosis not present

## 2018-04-03 DIAGNOSIS — I129 Hypertensive chronic kidney disease with stage 1 through stage 4 chronic kidney disease, or unspecified chronic kidney disease: Secondary | ICD-10-CM | POA: Diagnosis not present

## 2018-04-03 DIAGNOSIS — M5430 Sciatica, unspecified side: Secondary | ICD-10-CM

## 2018-04-03 DIAGNOSIS — M5432 Sciatica, left side: Secondary | ICD-10-CM | POA: Diagnosis not present

## 2018-04-03 DIAGNOSIS — R079 Chest pain, unspecified: Secondary | ICD-10-CM | POA: Diagnosis not present

## 2018-04-03 DIAGNOSIS — I10 Essential (primary) hypertension: Secondary | ICD-10-CM | POA: Diagnosis not present

## 2018-04-03 DIAGNOSIS — R0789 Other chest pain: Secondary | ICD-10-CM | POA: Diagnosis not present

## 2018-04-03 LAB — CBC WITH DIFFERENTIAL/PLATELET
BASOS ABS: 0.1 10*3/uL (ref 0.0–0.1)
BASOS PCT: 1 %
Eosinophils Absolute: 0.2 10*3/uL (ref 0.0–0.7)
Eosinophils Relative: 2 %
HEMATOCRIT: 36.5 % (ref 36.0–46.0)
Hemoglobin: 12.2 g/dL (ref 12.0–15.0)
Lymphocytes Relative: 36 %
Lymphs Abs: 2.9 10*3/uL (ref 0.7–4.0)
MCH: 30 pg (ref 26.0–34.0)
MCHC: 33.4 g/dL (ref 30.0–36.0)
MCV: 89.7 fL (ref 78.0–100.0)
MONOS PCT: 7 %
Monocytes Absolute: 0.6 10*3/uL (ref 0.1–1.0)
NEUTROS ABS: 4.4 10*3/uL (ref 1.7–7.7)
NEUTROS PCT: 54 %
Platelets: 273 10*3/uL (ref 150–400)
RBC: 4.07 MIL/uL (ref 3.87–5.11)
RDW: 13.7 % (ref 11.5–15.5)
WBC: 8.2 10*3/uL (ref 4.0–10.5)

## 2018-04-03 LAB — URINALYSIS, ROUTINE W REFLEX MICROSCOPIC
Bilirubin Urine: NEGATIVE
Glucose, UA: NEGATIVE mg/dL
Hgb urine dipstick: NEGATIVE
Ketones, ur: NEGATIVE mg/dL
LEUKOCYTES UA: NEGATIVE
NITRITE: NEGATIVE
PH: 8 (ref 5.0–8.0)
Protein, ur: NEGATIVE mg/dL
SPECIFIC GRAVITY, URINE: 1.005 (ref 1.005–1.030)

## 2018-04-03 LAB — BASIC METABOLIC PANEL
ANION GAP: 9 (ref 5–15)
BUN: 19 mg/dL (ref 8–23)
CHLORIDE: 101 mmol/L (ref 98–111)
CO2: 24 mmol/L (ref 22–32)
Calcium: 9.4 mg/dL (ref 8.9–10.3)
Creatinine, Ser: 0.95 mg/dL (ref 0.44–1.00)
GFR calc non Af Amer: 49 mL/min — ABNORMAL LOW (ref >60)
GFR, EST AFRICAN AMERICAN: 57 mL/min — AB (ref >60)
Glucose, Bld: 90 mg/dL (ref 70–99)
POTASSIUM: 4 mmol/L (ref 3.5–5.1)
SODIUM: 134 mmol/L — AB (ref 135–145)

## 2018-04-03 LAB — LIPID PANEL
CHOLESTEROL: 122 mg/dL (ref 0–200)
HDL: 39 mg/dL — ABNORMAL LOW (ref >40)
LDL Cholesterol: 43 mg/dL (ref 0–99)
TRIGLYCERIDES: 201 mg/dL — AB (ref <150)
Total CHOL/HDL Ratio: 3.1 RATIO
VLDL: 40 mg/dL (ref 0–40)

## 2018-04-03 LAB — TROPONIN I

## 2018-04-03 MED ORDER — ENOXAPARIN SODIUM 30 MG/0.3ML ~~LOC~~ SOLN: 30.0000 mg | SUBCUTANEOUS | Status: DC

## 2018-04-03 MED ORDER — PREDNISONE 20 MG PO TABS: 40.0000 mg | ORAL_TABLET | Freq: Every day | ORAL | 0 refills | Status: DC

## 2018-04-03 MED ORDER — GABAPENTIN 100 MG PO CAPS: 100.0000 mg | ORAL_CAPSULE | Freq: Three times a day (TID) | ORAL | 0 refills | Status: AC

## 2018-04-03 NOTE — Progress Notes (Signed)
PROGRESS NOTE  Amber Bender:[id number] DOB: 1920-08-18 DOA: 04/02/2018 PCP: [name], MD   LOS: 0 days   Brief Narrative / Interim history: Amber Bender is a 84 y/o female with medical history significant for HTN, HLD, CAD s/p stent 1997, CKD Stage III, and sciatica, who presented to the ED 9/23 with SOB and significant back pain radiating down her leg since the early AM. Patient has been experiencing worsening left sided sciatica pain for about a month and has been seen by ortho and received 2 steroid injections, the last about 1.5 weeks ago. Reports she takes Tylenol arthritis for the pain, however the past few days in the setting of significant pain her BP has been elevated with dizziness and shortness of breath. Pt called her PCP and due to history of CAD with similar presentation in the past, recommended she go to the ED. Upon admission, patient's BP was elevated 144/93-176/75 with other vitals stable. CXR negative for acute abnormality. Troponin negative. EKG showed no acute changes. Labs significant for BUN 20 and Cr 1.03, close to her baseline. Due to history of CAD and complaint of left sided pain, patient was initially given 325mg  ASA and admitted for rule out of chest pain.    Subjective: Patient states after having pain meds this morning, the pain in her left leg feels better. Denies SOB, chest pain, dizziness. Reports some nausea but attributes to having not eaten yet.  Assessment & Plan: Principal Problem:   Chest pain Active Problems:   History of myocardial infarction/prior stent   HTN (hypertension)   HLD (hyperlipidemia)   CKD (chronic kidney disease), stage III (HCC)   Urinary frequency   Hyponatremia   Chest pain, History of MI s/p stent - Patient remains very active partaking in regular water aerobics classes, but recently has not been going due to worsening sciatica pain; in this setting her BP has been elevated accompanied by dizziness and shortness of  breath - Denies chest pain today, PCP was concerned about SOB due to h/o CAD - 2D echo 09/2017 EF 65-70% with grade 1 diastolic dysfunction - CXR negative, EKG negative for ischemic changes, serial Troponins remain negative  - Cardio consulted and suggest symptoms are related to musculoskeletal pain and recommend no further work up and will arrange follow up with Dr. Martinique  Sciatica  - Patient has h/o sciatica, received epidural injection about 10 years ago and has remained asymptomatic until recently  - Pain has been worsening for about the past month and is currently being followed by Ortho and states she has received 2 steroid injects in her spine in the past 4 weeks, the last being about 1.5 weeks ago; takes Tylenol arthritis in the morning and Tramadol PRN for pain - Start on Prednisone x3 days and gabapentin  - Follow up with Ortho   Hypertension, Hyperlipidemia - Continue lisinopril HCTZ, BP has remained stable, continue to monitor - Lipid panel unremarkable, continue simvastatin  CKD Stage III - BUN 20 and Cr 1.03 on admission, about baseline for patient - Given 41mL NS overnight, remains asymptomatic and stable   Hyponatremia, resolved - Na 132 on admission likely in the setting of mild dehydration, 134 today  Urinary frequency - UA grossly negative for UTI   Scheduled Meds: . aspirin  81 mg Oral QHS  . enoxaparin (LOVENOX) injection  30 mg Subcutaneous Q24H  . guaiFENesin  600 mg Oral BID  . lisinopril  20 mg Oral Daily  And  . hydrochlorothiazide  12.5 mg Oral Daily  . Influenza vac split quadrivalent PF  0.5 mL Intramuscular Tomorrow-1000  . loratadine  10 mg Oral Daily  . simvastatin  20 mg Oral QPM   Continuous Infusions: PRN Meds:.acetaminophen, albuterol, diclofenac sodium, gi cocktail, hydrALAZINE, nitroGLYCERIN, ondansetron (ZOFRAN) IV, pantoprazole, polyvinyl alcohol, traMADol  DVT prophylaxis: Lovenox Code Status: DNR Family Communication: none at  bedside Disposition Plan: home  Consultants:   cardiology  Procedures:   none  Antimicrobials:  none   Objective: Vitals:   04/02/18 2147 04/03/18 0620 04/03/18 0845 04/03/18 0933  BP: (!) 156/53 131/66  135/90  Pulse: 64 (!) 57  61  Resp: 16 16  18   Temp: 98 F (36.7 C) 97.8 F (36.6 C)    TempSrc: Oral Oral    SpO2:  97% 94% 97%  Weight: 60.3 kg     Height: 5' (1.524 m)       Intake/Output Summary (Last 24 hours) at 04/03/2018 1144 Last data filed at 04/03/2018 0600 Gross per 24 hour  Intake 500.57 ml  Output 200 ml  Net 300.57 ml   Filed Weights   04/02/18 2110 04/02/18 2147  Weight: 60.3 kg 60.3 kg    Examination:  Constitutional: Sitting comfortably in chair, NAD Respiratory: bilaterally clear to auscultation, no wheezing Cardiovascular: regular rate and rhythm, no murmurs Abdomen: soft, non tender  Musculoskeletal: minimal tenderness to palpation along left posterior LE Skin: no rashes, lesions, ulcers Neurologic: alert and oriented x3, non focal Psychiatric: Normal mood and affect   Data Reviewed: I have independently reviewed following labs and imaging studies  CBC: Recent Labs  Lab 04/02/18 1623 04/03/18 0420  WBC 8.1 8.2  NEUTROABS  --  4.4  HGB 13.0 12.2  HCT 38.2 36.5  MCV 89.3 89.7  PLT 321 621   Basic Metabolic Panel: Recent Labs  Lab 04/02/18 1623 04/03/18 0420  NA 132* 134*  K 3.8 4.0  CL 95* 101  CO2 28 24  GLUCOSE 100* 90  BUN 20 19  CREATININE 1.03* 0.95  CALCIUM 10.0 9.4   GFR: Estimated Creatinine Clearance: 28.7 mL/min (by C-G formula based on SCr of 0.95 mg/dL). Liver Function Tests: No results for input(s): AST, ALT, ALKPHOS, BILITOT, PROT, ALBUMIN in the last 168 hours. No results for input(s): LIPASE, AMYLASE in the last 168 hours. No results for input(s): AMMONIA in the last 168 hours. Coagulation Profile: No results for input(s): INR, PROTIME in the last 168 hours. Cardiac Enzymes: Recent Labs  Lab  04/02/18 2126 04/03/18 0003 04/03/18 0420  TROPONINI <0.03 <0.03 <0.03   BNP (last 3 results) No results for input(s): PROBNP in the last 8760 hours. HbA1C: No results for input(s): HGBA1C in the last 72 hours. CBG: No results for input(s): GLUCAP in the last 168 hours. Lipid Profile: Recent Labs    04/03/18 0420  CHOL 122  HDL 39*  LDLCALC 43  TRIG 201*  CHOLHDL 3.1   Thyroid Function Tests: Recent Labs    04/02/18 2133  TSH 1.408   Anemia Panel: No results for input(s): VITAMINB12, FOLATE, FERRITIN, TIBC, IRON, RETICCTPCT in the last 72 hours. Urine analysis:    Component Value Date/Time   COLORURINE STRAW (A) 04/02/2018 2349   APPEARANCEUR CLEAR 04/02/2018 2349   LABSPEC 1.005 04/02/2018 2349   PHURINE 8.0 04/02/2018 [place] 04/02/2018 2349   HGBUR NEGATIVE 04/02/2018 [place] 04/02/2018 2349   BILIRUBINUR neg 04/02/2012 1618  KETONESUR NEGATIVE 04/02/2018 2349   PROTEINUR NEGATIVE 04/02/2018 2349   UROBILINOGEN 0.2 05/30/2014 1423   NITRITE NEGATIVE 04/02/2018 2349   LEUKOCYTESUR NEGATIVE 04/02/2018 2349   Sepsis Labs: Invalid input(s): PROCALCITONIN, LACTICIDVEN  No results found for this or any previous visit (from the past 240 hour(s)).    Radiology Studies: Dg Chest 2 View  Result Date: 04/02/2018 CLINICAL DATA:  Cough, weakness and LEFT chest pain for a few days. Shortness of breath. History of melanoma. Nonsmoker. EXAM: CHEST - 2 VIEW COMPARISON:  Chest radiograph October 02, 2017 FINDINGS: Cardiomediastinal silhouette is normal. Tortuous aorta, calcified aortic arch. No pleural effusions or focal consolidations. Trachea projects midline and there is no pneumothorax. Soft tissue planes and included osseous structures are non-suspicious. Thoracolumbar dextroscoliosis. IMPRESSION: 1. No acute cardiopulmonary process. 2.  Aortic Atherosclerosis (ICD10-I70.0). Electronically Signed   By: Elon Alas M.D.   On:  04/02/2018 18:25       Time spent: [place], PA-S [facility]  If 7PM-7AM, please contact night-coverage [email or web address] Password [email or web address] 04/03/2018, 11:44 AM   [email or web address] @

## 2018-04-03 NOTE — Progress Notes (Signed)
Patient remains a&ox4, ambulatory with cane. Discharge instructions reviewed, questions concerns denied.

## 2018-04-03 NOTE — Care Management Note (Signed)
Case Management Note  Patient Details  Name: Amber Bender MRN: [id number] Date of Birth: 03-13-1921  Subjective/Objective:  Admitted w/chest pain. From home, has cane. Son @ home-works during the day,& @ home in the evening. HHPT ordered. [facility] chosen-rep Santiago Glad aware of order & d/c.  No further CM needs.                Action/Plan:dc home w/HHC.   Expected Discharge Date:  04/05/18               Expected Discharge Plan:  [place]  In-House Referral:     Discharge planning Services  CM Consult  Post Acute Care Choice:  (cane) Choice offered to:  Patient  DME Arranged:    DME Agency:     HH Arranged:  PT [place]:  [place]  Status of Service:  Completed, signed off  If discussed at [place] of Stay Meetings, dates discussed:    Additional Comments:  Dessa Phi, RN 04/03/2018, 2:18 PM

## 2018-04-03 NOTE — Progress Notes (Signed)
Resumed care of pt. Agree with previous RN's assessment. Will continue with plan of care.  

## 2018-04-03 NOTE — Consult Note (Addendum)
Cardiology Consultation:   Patient ID: Amber Bender; [id number]; [birth year]   Admit date: 04/02/2018 Date of Consult: 04/03/2018  Primary Care Provider: [name], MD Primary Cardiologist: Peter Martinique, MD Primary Electrophysiologist:  None   Patient Profile:   Amber Bender is a 84 y.o. female with a hx of HTN, HLD, MI>>stent 1997, CKD III, sciatica, pituitary adenoma s/p TSS, melanoma, OA, who is being seen today for the evaluation of chest pain at the request of Dr Fuller Plan.   History of Present Illness:   [name] was hospitalized in March 2019 w/ L arm pain associated w/ SBP 190. Not similar to previous angina which was SOB w/ weakness and fatigue.    Office visit 10/17/2017, pt doing well and cleared for knee injections.   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  This decreased her activity and she had to quit exercising.  She denies increasing dyspnea on exertion before the daily event.  She denies palpitations.  She denies lower extremity edema, orthopnea or PND.  She has never had chest pain, except for some left lateral and right lateral chest pain.  This pain has not been consistent with exertion.  Yesterday, she woke with significant back pain that was going down her leg.  She also noted shortness of breath and weakness.  Her blood pressure at the time was very high.  Since admission, the back pain is improved, but not resolved.  The hypertension is improved.  The shortness of breath is resolved.  [name] notes that she has had symptoms of shortness of breath and weakness in the past when her blood pressure was high.  However, she also believes she had these symptoms prior to her heart attack.  She has not had the symptoms during her normal level of activity, but says that she does get some shortness of breath when she  tries to walk fast or carry something heavy.  This does not happen often.  She never gets lightheaded or dizzy except in the setting of very high blood pressure.   Past Medical History:  Diagnosis Date  . Allergy   . Arthritis   . Cancer ([place])   . Cataract   . CKD (chronic kidney disease), stage III ([place])   . History of heart artery stent   . HLD (hyperlipidemia)   . HTN (hypertension)   . Melanoma ([place])   . Myocardial infarction ([place]) 1997    Past Surgical History:  Procedure Laterality Date  . ABDOMINAL HYSTERECTOMY    . APPENDECTOMY    . BREAST SURGERY    . CARDIAC CATHETERIZATION    . EYE SURGERY    . PITUITARY SURGERY     Removal of tumor     Prior to Admission medications   Medication Sig Start Date End Date Taking? Authorizing Provider  Acetaminophen (TYLENOL ARTHRITIS PAIN PO) Take 2 tablets by mouth daily as needed (pain).    Yes [provider]  aspirin 81 MG tablet Take 81 mg by mouth at bedtime.    Yes [provider]  cholecalciferol (VITAMIN D) 1000 units tablet Take 1,000 Units by mouth daily.   Yes [provider]  diclofenac sodium (VOLTAREN) 1 % GEL Apply 2 g topically daily as needed (pain).    Yes [provider]  lisinopril-hydrochlorothiazide (PRINZIDE,ZESTORETIC) 20-12.5 MG per tablet Take 1  tablet by mouth daily.   Yes [provider]  Multiple Vitamins-Minerals (PRESERVISION AREDS 2) CAPS Take 1 capsule by mouth 2 (two) times daily.   Yes [provider]  omeprazole (PRILOSEC) 20 MG capsule Take 1 capsule (20 mg total) by mouth daily. Patient taking differently: Take 20 mg by mouth daily as needed (acid reflux).  05/30/14  Yes Plunkett, Loree Fee, MD  Polyvinyl Alcohol-Povidone (REFRESH OP) Place 2 drops into both eyes daily as needed (dry eyes).   Yes [provider]  simvastatin (ZOCOR) 20 MG tablet Take 20 mg by mouth every evening.   Yes [provider]  traMADol (ULTRAM) 50 MG  tablet Take 1 tablet (50 mg total) by mouth every 6 (six) hours as needed for severe pain. Patient not taking: Reported on 04/02/2018 10/03/17   [name], MD    Inpatient Medications: Scheduled Meds: . aspirin  81 mg Oral QHS  . enoxaparin (LOVENOX) injection  30 mg Subcutaneous Q24H  . guaiFENesin  600 mg Oral BID  . lisinopril  20 mg Oral Daily   And  . hydrochlorothiazide  12.5 mg Oral Daily  . Influenza vac split quadrivalent PF  0.5 mL Intramuscular Tomorrow-1000  . loratadine  10 mg Oral Daily  . simvastatin  20 mg Oral QPM   Continuous Infusions:  PRN Meds: acetaminophen, albuterol, diclofenac sodium, gi cocktail, hydrALAZINE, nitroGLYCERIN, ondansetron (ZOFRAN) IV, pantoprazole, polyvinyl alcohol, traMADol  Allergies:    Allergies  Allergen Reactions  . Erythromycin Other (See Comments)    Stomach cramps  . Niacin And Related Hives  . Ciprofloxacin Rash  . Septra [Sulfamethoxazole-Trimethoprim] Rash  . Sulfa Antibiotics Rash    Social History:   Social History   Socioeconomic History  . Marital status: Widowed    Spouse name: Not on file  . Number of children: Not on file  . Years of education: Not on file  . Highest education level: Not on file  Occupational History  . Occupation: Retired  Scientific laboratory technician  . Financial resource strain: Not hard at all  . Food insecurity:    Worry: Patient refused    Inability: Patient refused  . Transportation needs:    Medical: Patient refused    Non-medical: Patient refused  Tobacco Use  . Smoking status: Never Smoker  . Smokeless tobacco: Never Used  Substance and Sexual Activity  . Alcohol use: Yes    Alcohol/week: 0.0 standard drinks    Comment: occasional  . Drug use: No  . Sexual activity: Not Currently    Comment: widow  Lifestyle  . Physical activity:    Days per week: Patient refused    Minutes per session: Patient refused  . Stress: Not at all  Relationships  . Social connections:    Talks on  phone: Twice a week    Gets together: Twice a week    Attends religious service: 1 to 4 times per year    Active member of club or organization: Yes    Attends meetings of clubs or organizations: 1 to 4 times per year    Relationship status: Widowed  . Intimate partner violence:    Fear of current or ex partner: Patient refused    Emotionally abused: Patient refused    Physically abused: Patient refused    Forced sexual activity: Patient refused  Other Topics Concern  . 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    Family History:  Family History  Problem Relation Age of Onset  . Cancer Mother   . Heart attack Father        died at age 89    Family Status:  Family Status  Relation Name Status  . Mother  Deceased  . Father  Deceased  . Brother  Deceased    ROS:  Please see the history of present illness.  All other ROS reviewed and negative.     Physical Exam/Data:   Vitals:   04/02/18 2147 04/03/18 0620 04/03/18 0845 04/03/18 0933  BP: (!) 156/53 131/66  135/90  Pulse: 64 (!) 57  61  Resp: 16 16  18   Temp: 98 F (36.7 C) 97.8 F (36.6 C)    TempSrc: Oral Oral    SpO2:  97% 94% 97%  Weight: 60.3 kg     Height: 5' (1.524 m)       Intake/Output Summary (Last 24 hours) at 04/03/2018 1103 Last data filed at 04/03/2018 0600 Gross per 24 hour  Intake 500.57 ml  Output 200 ml  Net 300.57 ml   Filed Weights   04/02/18 2110 04/02/18 2147  Weight: 60.3 kg 60.3 kg   Body mass index is 25.96 kg/m.  General:  Well nourished, well developed, in no acute distress HEENT: normal Lymph: no adenopathy Neck: no JVD Endocrine:  No thryomegaly Vascular: No carotid bruits; 4/4 extremity pulses 2+, without bruits  Cardiac:  normal S1, S2; RRR; no murmur, left lateral chest wall tenderness noted Lungs:  clear to auscultation bilaterally, no wheezing, rhonchi or rales  Abd: soft, nontender, no hepatomegaly  Ext: no  edema Musculoskeletal:  No deformities, BUE and BLE strength normal and equal for age Skin: warm and dry  Neuro:  CNs 2-12 intact, no focal abnormalities noted Psych:  Normal affect   EKG:  The EKG was personally reviewed and demonstrates: 9/24, sinus rhythm, heart rate 62, no acute ischemic changes, no pathologic Q waves, normal intervals Telemetry:  Telemetry was personally reviewed and demonstrates: Sinus rhythm, mostly sinus bradycardia, heart rate into the high 40s at times  Relevant CV Studies:  Echocardiogram 10/03/2017  Left ventricle: The cavity size was normal. Wall thickness was normal. Systolic function was vigorous. The estimated ejection fraction was in the range of 65% to 70%. Wall motion was normal; there were no regional wall motion abnormalities. Doppler parameters are consistent with abnormal left ventricular relaxation (grade 1 diastolic dysfunction). Doppler parameters are consistent with high ventricular filling pressure.  Impressions:  - Vigorous LV systolic function; mild diastolic dysfunction; elevated LV filling pressure.  Laboratory Data:  Chemistry Recent Labs  Lab 04/02/18 1623 04/03/18 0420  NA 132* 134*  K 3.8 4.0  CL 95* 101  CO2 28 24  GLUCOSE 100* 90  BUN 20 19  CREATININE 1.03* 0.95  CALCIUM 10.0 9.4  GFRNONAA 45* 49*  GFRAA 52* 57*  ANIONGAP 9 9    Lab Results  Component Value Date   ALT 11 (L) 10/02/2017   AST 18 10/02/2017   ALKPHOS 88 10/02/2017   BILITOT 0.6 10/02/2017   Hematology Recent Labs  Lab 04/02/18 1623 04/03/18 0420  WBC 8.1 8.2  RBC 4.28 4.07  HGB 13.0 12.2  HCT 38.2 36.5  MCV 89.3 89.7  MCH 30.4 30.0  MCHC 34.0 33.4  RDW 13.6 13.7  PLT 321 273   Cardiac Enzymes Recent Labs  Lab 04/02/18 2126 04/03/18 0003 04/03/18 0420  TROPONINI <0.03 <0.03 <0.03    Recent Labs  Lab 04/02/18 1757  TROPIPOC 0.00    TSH:  Lab Results  Component Value Date   TSH 1.408 04/02/2018    Lipids: Lab Results  Component Value Date   CHOL 122 04/03/2018   HDL 39 (L) 04/03/2018   LDLCALC 43 04/03/2018   TRIG 201 (H) 04/03/2018   CHOLHDL 3.1 04/03/2018    Radiology/Studies:  Dg Chest 2 View  Result Date: 04/02/2018 CLINICAL DATA:  Cough, weakness and LEFT chest pain for a few days. Shortness of breath. History of melanoma. Nonsmoker. EXAM: CHEST - 2 VIEW COMPARISON:  Chest radiograph October 02, 2017 FINDINGS: Cardiomediastinal silhouette is normal. Tortuous aorta, calcified aortic arch. No pleural effusions or focal consolidations. Trachea projects midline and there is no pneumothorax. Soft tissue planes and included osseous structures are non-suspicious. Thoracolumbar dextroscoliosis. IMPRESSION: 1. No acute cardiopulmonary process. 2.  Aortic Atherosclerosis (ICD10-I70.0). Electronically Signed   By: Elon Alas M.D.   On: 04/02/2018 18:25    Assessment and Plan:   Principal Problem:  1. Chest pain -ECG is not acute and cardiac enzymes have all been negative - She has an excellent activity level with no recent limitations except from musculoskeletal issues - Echocardiogram 6 months ago showed a normal EF - Symptoms occurred in the setting of musculoskeletal pain and elevated blood pressure as high as 185/71 - SBP 130s-150s on current medications. -Discuss with MD if any further work-up is indicated -We will arrange a follow-up appointment with Dr. Martinique  Otherwise, per IM Active Problems:   History of myocardial infarction/prior stent   HTN (hypertension)   HLD (hyperlipidemia)   CKD (chronic kidney disease), stage III ([place])   Urinary frequency   Hyponatremia     For questions or updates, please contact [facility] Please consult [email or web address] for contact info under Cardiology/STEMI.   Signed, Rosaria Ferries, PA-C  04/03/2018 11:03 AM  History and all data above reviewed.  Patient examined.  I agree with the findings as above.  Upper epigastric  pain with some mild discomfort in her chest.  Mostly complains of pain in her left sciatic area and BPs going up too high.  No objective evidence if ischemia.   The patient exam reveals COR:RRR  ,  Lungs: Clear  ,  Abd: Positive bowel sounds, no rebound no guarding, Ext No edema  .  All available labs, radiology testing, previous records reviewed. Agree with documented assessment and plan. Chest pain:  Atypical.  No objective evidence of ischemia.  No further work up.    Jeneen Rinks Francis Doenges  3:21 PM  04/03/2018

## 2018-04-03 NOTE — Discharge Summary (Signed)
Physician Discharge Summary  Amber Bender  SKA:[id number]  DOB: 1920/07/27  DOA: 04/02/2018 PCP: [name], MD  Admit date: 04/02/2018 Discharge date: 04/03/2018  Admitted From: Home  Disposition: Home   Recommendations for Outpatient Follow-up:  1. Follow up with PCP in 1 week  2. Follow up with cardiology 2-4 weeks  3. Follow up with orthopedic in 1-2 weeks   Home Health: PT  Discharge Condition: Stable  CODE STATUS: DNR  Diet recommendation: Heart Healthy   Brief/Interim Summary: For full details see H&P/Progress note, but in brief, Amber Bender is a 84 year old female with medical history significant for hypertension, hyperlipidemia, CAD s/p stent 1997, CKD stage III and chronic sciatic pain. Patient presented complaining of shortness of breath and significant back pain radiating down to her left leg.  Patient reported experiencing worsening of sciatic pain for about a month has been seen by Ortho and received steroid injections however this episode was significantly worsened did not tolerate the pain, her blood pressure became elevated felt dizzy and short of breath.  She called PCP who prompted her to come to the emergency department.  Upon ED evaluation BP was elevated with systolic BP up to 203T, chest x-ray was negative for acute abnormality, troponin negative, EKG normal sinus rhythm and no other lab abnormalities.  Patient was admitted with working diagnosis of rule out chest pain.  Subjective: Patient seen and examined, she reports to me that she never had chest pain he was mainly that she difficulty breathing and dizziness due to her blood pressure being elevated.  She relates all the events from her severe pain on her left leg.  Denies palpitations, dizziness and chest pain at this time.  Discharge Diagnoses/Hospital Course:  Atypical chest pain Felt to be related to anxiety from severe sciatic pain.  Prompting to elevation of the blood pressure leading to  dizziness and some difficulty breathing.  Patient denies having chest pain.  Cardiac work-up in the hospital stay included EKG which was normal sinus rhythms x2, troponin negative x3. ECHO 09/2017 shows normal LVEF with G1DD.  Cardiology evaluated patient and recommended no further cardiac work-up at this time and follow-up as an outpatient.  Sciatic pain Patient with chronic history of sciatica.  Received epidural injection about 10 years ago and was asymptomatic until recently Symptoms have been worsening over the past 2 months.  Will continue Tylenol and tramadol as needed for pain, will start trial of prednisone 40 mg x 3 days then gabapentin 100 mg 3 times daily and titrate as tolerated.  Follow-up with orthopedics surgery  CKD stage III Creatinine at baseline, follow-up with PCP  Hypertension BP remains stable during hospital stay, continue home regimen with no changes  All other chronic medical condition were stable during the hospitalization.  On the day of the discharge the patient's vitals were stable, and no other acute medical condition were reported by patient. the patient was felt safe to be discharge to home  Discharge Instructions  You were cared for by a hospitalist during your hospital stay. If you have any questions about your discharge medications or the care you received while you were in the hospital after you are discharged, you can call the unit and asked to speak with the hospitalist on call if the hospitalist that took care of you is not available. Once you are discharged, your primary care physician will handle any further medical issues. Please note that NO REFILLS for any discharge medications will be authorized  once you are discharged, as it is imperative that you return to your primary care physician (or establish a relationship with a primary care physician if you do not have one) for your aftercare needs so that they can reassess your need for medications and monitor  your lab values.  Discharge Instructions    Call MD for:  difficulty breathing, headache or visual disturbances   Complete by:  As directed    Call MD for:  extreme fatigue   Complete by:  As directed    Call MD for:  hives   Complete by:  As directed    Call MD for:  persistant dizziness or light-headedness   Complete by:  As directed    Call MD for:  persistant nausea and vomiting   Complete by:  As directed    Call MD for:  redness, tenderness, or signs of infection (pain, swelling, redness, odor or green/yellow discharge around incision site)   Complete by:  As directed    Call MD for:  severe uncontrolled pain   Complete by:  As directed    Call MD for:  temperature >100.4   Complete by:  As directed    Diet - low sodium heart healthy   Complete by:  As directed    Increase activity slowly   Complete by:  As directed      Allergies as of 04/03/2018      Reactions   Erythromycin Other (See Comments)   Stomach cramps   Niacin And Related Hives   Ciprofloxacin Rash   Septra [sulfamethoxazole-trimethoprim] Rash   Sulfa Antibiotics Rash      Medication List    TAKE these medications   aspirin 81 MG tablet Take 81 mg by mouth at bedtime.   cholecalciferol 1000 units tablet Commonly known as:  VITAMIN D Take 1,000 Units by mouth daily.   diclofenac sodium 1 % Gel Commonly known as:  VOLTAREN Apply 2 g topically daily as needed (pain).   gabapentin 100 MG capsule Commonly known as:  NEURONTIN Take 1 capsule (100 mg total) by mouth 3 (three) times daily.   lisinopril-hydrochlorothiazide 20-12.5 MG tablet Commonly known as:  PRINZIDE,ZESTORETIC Take 1 tablet by mouth daily.   omeprazole 20 MG capsule Commonly known as:  PRILOSEC Take 1 capsule (20 mg total) by mouth daily. What changed:    when to take this  reasons to take this   predniSONE 20 MG tablet Commonly known as:  DELTASONE Take 2 tablets (40 mg total) by mouth daily with breakfast.    PRESERVISION AREDS 2 Caps Take 1 capsule by mouth 2 (two) times daily.   REFRESH OP Place 2 drops into both eyes daily as needed (dry eyes).   simvastatin 20 MG tablet Commonly known as:  ZOCOR Take 20 mg by mouth every evening.   traMADol 50 MG tablet Commonly known as:  ULTRAM Take 1 tablet (50 mg total) by mouth every 6 (six) hours as needed for severe pain.   TYLENOL ARTHRITIS PAIN PO Take 2 tablets by mouth daily as needed (pain).      Follow-up Information    Martinique, Peter M, MD Follow up.   Specialty:  Cardiology Why:  The office will call you if an earlier appointment is available, otherwise, keep the December appointment. Contact information: [address] [place] [address] [place].   Specialty:  Home Health Services Why:  [place] [facility] [facility] Contact information: [place] [address] [phone]        [name], MD. Schedule an appointment as soon as possible for a visit in 1 week(s).   Specialty:  Internal Medicine Why:  Hospital follow up  Contact information: [address] [place] [address] [phone]          Allergies  Allergen Reactions  . Erythromycin Other (See Comments)    Stomach cramps  . Niacin And Related Hives  . Ciprofloxacin Rash  . Septra [Sulfamethoxazole-Trimethoprim] Rash  . Sulfa Antibiotics Rash    Consultations:  Cardiology   Procedures/Studies: Dg Chest 2 View  Result Date: 04/02/2018 CLINICAL DATA:  Cough, weakness and LEFT chest pain for a few days. Shortness of breath. History of melanoma. Nonsmoker. EXAM: CHEST - 2 VIEW COMPARISON:  Chest radiograph October 02, 2017 FINDINGS: Cardiomediastinal silhouette is normal. Tortuous aorta, calcified aortic arch. No pleural effusions or focal consolidations. Trachea projects midline and there is no pneumothorax. Soft tissue planes and included osseous structures  are non-suspicious. Thoracolumbar dextroscoliosis. IMPRESSION: 1. No acute cardiopulmonary process. 2.  Aortic Atherosclerosis (ICD10-I70.0). Electronically Signed   By: Elon Alas M.D.   On: 04/02/2018 18:25    Discharge Exam: Vitals:   04/03/18 0933 04/03/18 1326  BP: 135/90 119/65  Pulse: 61 67  Resp: 18   Temp:  97.8 F (36.6 C)  SpO2: 97% 97%   Vitals:   04/03/18 0620 04/03/18 0845 04/03/18 0933 04/03/18 1326  BP: 131/66  135/90 119/65  Pulse: (!) 57  61 67  Resp: 16  18   Temp: 97.8 F (36.6 C)   97.8 F (36.6 C)  TempSrc: Oral   Oral  SpO2: 97% 94% 97% 97%  Weight:      Height:        General: Pt is alert, awake, not in acute distress Cardiovascular: RRR, S1/S2 +, no rubs, no gallops Respiratory: CTA bilaterally, no wheezing, no rhonchi Abdominal: Soft, NT, ND, bowel sounds + Extremities: no edema, no cyanosis    The results of significant diagnostics from this hospitalization (including imaging, microbiology, ancillary and laboratory) are listed below for reference.     Microbiology: No results found for this or any previous visit (from the past 240 hour(s)).   Labs: BNP (last 3 results) No results for input(s): BNP in the last 8760 hours. Basic Metabolic Panel: Recent Labs  Lab 04/02/18 1623 04/03/18 0420  NA 132* 134*  K 3.8 4.0  CL 95* 101  CO2 28 24  GLUCOSE 100* 90  BUN 20 19  CREATININE 1.03* 0.95  CALCIUM 10.0 9.4   Liver Function Tests: No results for input(s): AST, ALT, ALKPHOS, BILITOT, PROT, ALBUMIN in the last 168 hours. No results for input(s): LIPASE, AMYLASE in the last 168 hours. No results for input(s): AMMONIA in the last 168 hours. CBC: Recent Labs  Lab 04/02/18 1623 04/03/18 0420  WBC 8.1 8.2  NEUTROABS  --  4.4  HGB 13.0 12.2  HCT 38.2 36.5  MCV 89.3 89.7  PLT 321 273   Cardiac Enzymes: Recent Labs  Lab 04/02/18 2126 04/03/18 0003 04/03/18 0420  TROPONINI <0.03 <0.03 <0.03   BNP: Invalid input(s):  POCBNP CBG: No results for input(s): GLUCAP in the last 168 hours. D-Dimer No results for input(s): DDIMER in the last 72 hours. Hgb A1c No results for input(s): HGBA1C in the last 72 hours. Lipid Profile Recent Labs    04/03/18 0420  CHOL 122  HDL 39*  LDLCALC 43  TRIG 201*  CHOLHDL 3.1   Thyroid function studies Recent Labs    04/02/18 2133  TSH 1.408   Anemia work up No results for input(s): VITAMINB12, FOLATE, FERRITIN, TIBC, IRON, RETICCTPCT in the last 72 hours. Urinalysis    Component Value Date/Time   COLORURINE STRAW (A) 04/02/2018 2349   APPEARANCEUR CLEAR 04/02/2018 2349   LABSPEC 1.005 04/02/2018 2349   PHURINE 8.0 04/02/2018 2349   GLUCOSEU NEGATIVE 04/02/2018 2349   HGBUR NEGATIVE 04/02/2018 2349   BILIRUBINUR NEGATIVE 04/02/2018 2349   BILIRUBINUR neg 04/02/2012 1618   KETONESUR NEGATIVE 04/02/2018 2349   PROTEINUR NEGATIVE 04/02/2018 2349   UROBILINOGEN 0.2 05/30/2014 1423   NITRITE NEGATIVE 04/02/2018 2349   LEUKOCYTESUR NEGATIVE 04/02/2018 2349   Sepsis Labs Invalid input(s): PROCALCITONIN,  WBC,  LACTICIDVEN Microbiology No results found for this or any previous visit (from the past 240 hour(s)).  Time coordinating discharge: 32 minutes  SIGNED:  Chipper Oman, MD  [facility] 04/03/2018, 2:28 PM  Pager please text page via  [email or web address]  Note - This record has been created using [employer]. Chart creation errors have been sought, but may not always have been located. Such creation errors do not reflect on the standard of medical care.

## 2018-04-09 ENCOUNTER — Telehealth: Payer: Self-pay

## 2018-04-09 NOTE — Telephone Encounter (Signed)
-----   Message from Lonn Georgia, PA-C sent at 04/06/2018  4:06 PM EDT ----- Can you call her and let her know that we tried to get her in sooner but could not? If she wants to come back and see Lurena Joiner or myself or somebody sooner, that is okay Thanks ----- Message ----- From: Reola Mosher Sent: 04/03/2018  11:07 AM EDT To: Lonn Georgia, PA-C, Harold Hedge, CMA  Amber Bender is a lovely 84 year old lady who needs an appointment with Dr. Martinique. She says she called but could not get one until December. Can you help? Thanks

## 2018-04-09 NOTE — Telephone Encounter (Signed)
Left message for patient advising her to contact office if she wants an appt sooner that December. She can see Suanne Marker or Lurena Joiner. Advised patient to contact office with questions or concerns.

## 2018-06-25 NOTE — Progress Notes (Signed)
Cardiology Office Note   Date:  06/25/2018   ID:  Amber Bender, DOB 04/11/1921, MRN [id number]  PCP:  [name], MD  Cardiologist: Dr. Martinique  No chief complaint on file.    History of Present Illness: Amber Bender is a 84 y.o. female who is seen for follow up coronary artery disease, with history of MI in 1997 with placement to unknown artery, hypertension, dyslipidemia, history of chronic kidney disease stage III and pituitary adenoma status post TSS melanoma and osteoarthritis.  She was seen on consultation on 10/03/2017 by Dr. Mertie Moores during  hospitalization for hypertensive urgency.  She was ruled out for ACS with negative EKG and negative troponin x5.  She was to continue medical therapy.  Due to patient's age no further ischemic testing was planned. See in April in the office and was doing well.   She was seen by our service in September. She presented with severe sciatica pain and elevated BP. She had atypical epigastric and chest pain. Cardiac enzymes were normal. No further work up recommended.   She comes today. She is still suffering with sciatica. Has had 2 injections. Denies any chest pain or dyspnea. No edema. Reports BP normally in 120s but is elevated today. Has a mild nocturnal cough. Her primary care recommended switching lisinopril to [facility] but she has not switched yet.   Past Medical History:  Diagnosis Date  . Arthritis   . Cancer ([place])   . Cataract   . CKD (chronic kidney disease), stage III ([place])   . History of heart artery stent   . HLD (hyperlipidemia)   . HTN (hypertension)   . Melanoma ([place])   . Myocardial infarction ([place]) 1997    Past Surgical History:  Procedure Laterality Date  . ABDOMINAL HYSTERECTOMY    . APPENDECTOMY    . BREAST SURGERY    . CARDIAC CATHETERIZATION    . EYE SURGERY    . PITUITARY SURGERY     Removal of tumor     Current Outpatient Medications  Medication Sig Dispense Refill  . Acetaminophen  (TYLENOL ARTHRITIS PAIN PO) Take 2 tablets by mouth daily as needed (pain).     Marland Kitchen aspirin 81 MG tablet Take 81 mg by mouth at bedtime.     . cholecalciferol (VITAMIN D) 1000 units tablet Take 1,000 Units by mouth daily.    . diclofenac sodium (VOLTAREN) 1 % GEL Apply 2 g topically daily as needed (pain).     Marland Kitchen gabapentin (NEURONTIN) 100 MG capsule Take 1 capsule (100 mg total) by mouth 3 (three) times daily. 90 capsule 0  . lisinopril-hydrochlorothiazide (PRINZIDE,ZESTORETIC) 20-12.5 MG per tablet Take 1 tablet by mouth daily.    . Multiple Vitamins-Minerals (PRESERVISION AREDS 2) CAPS Take 1 capsule by mouth 2 (two) times daily.    Marland Kitchen omeprazole (PRILOSEC) 20 MG capsule Take 1 capsule (20 mg total) by mouth daily. (Patient taking differently: Take 20 mg by mouth daily as needed (acid reflux). ) 14 capsule 0  . Polyvinyl Alcohol-Povidone (REFRESH OP) Place 2 drops into both eyes daily as needed (dry eyes).    . predniSONE (DELTASONE) 20 MG tablet Take 2 tablets (40 mg total) by mouth daily with breakfast. 6 tablet 0  . simvastatin (ZOCOR) 20 MG tablet Take 20 mg by mouth every evening.    . traMADol (ULTRAM) 50 MG tablet Take 1 tablet (50 mg total) by mouth every 6 (six) hours as needed for severe pain. (Patient not taking:  Reported on 04/02/2018) 14 tablet 0   No current facility-administered medications for this visit.     Allergies:   Erythromycin; Niacin and related; Ciprofloxacin; Septra [sulfamethoxazole-trimethoprim]; and Sulfa antibiotics    Social History:  The patient  reports that she has never smoked. She has never used smokeless tobacco. She reports current alcohol use. She reports that she does not use drugs.   Family History:  The patient's family history includes Cancer in her mother; Heart attack in her father.    ROS: All other systems are reviewed and negative. Unless otherwise mentioned in H&P    PHYSICAL EXAM: VS:  There were no vitals taken for this visit. , BMI There  is no height or weight on file to calculate BMI. GENERAL:  Well appearing, elderly WF in NAD HEENT:  PERRL, EOMI, sclera are clear. Oropharynx is clear. NECK:  No jugular venous distention, carotid upstroke brisk and symmetric, no bruits, no thyromegaly or adenopathy LUNGS:  Clear to auscultation bilaterally CHEST:  Unremarkable HEART:  RRR,  PMI not displaced or sustained,S1 and S2 within normal limits, no S3, no S4: no clicks, no rubs, no murmurs ABD:  Soft, nontender. BS +, no masses or bruits. No hepatomegaly, no splenomegaly EXT:  2 + pulses throughout, no edema, no cyanosis no clubbing SKIN:  Warm and dry.  No rashes NEURO:  Alert and oriented x 3. Cranial nerves II through XII intact. PSYCH:  Cognitively intact  Recent Labs: 10/02/2017: ALT 11 04/02/2018: TSH 1.408 04/03/2018: BUN 19; Creatinine, Ser 0.95; Hemoglobin 12.2; Platelets 273; Potassium 4.0; Sodium 134    Lipid Panel    Component Value Date/Time   CHOL 122 04/03/2018 0420   TRIG 201 (H) 04/03/2018 0420   HDL 39 (L) 04/03/2018 0420   CHOLHDL 3.1 04/03/2018 0420   VLDL 40 04/03/2018 0420   LDLCALC 43 04/03/2018 0420      Wt Readings from Last 3 Encounters:  04/02/18 132 lb 15 oz (60.3 kg)  10/17/17 140 lb 9.6 oz (63.8 kg)  05-Oct-2017 139 lb 5.3 oz (63.2 kg)      Other studies Reviewed: Echocardiogram 10-05-2017  Left ventricle: The cavity size was normal. Wall thickness was   normal. Systolic function was vigorous. The estimated ejection   fraction was in the range of 65% to 70%. Wall motion was normal;   there were no regional wall motion abnormalities. Doppler   parameters are consistent with abnormal left ventricular   relaxation (grade 1 diastolic dysfunction). Doppler parameters   are consistent with high ventricular filling pressure.  Impressions:  - Vigorous LV systolic function; mild diastolic dysfunction;   elevated LV filling pressure.   ASSESSMENT AND PLAN:  1. CAD: MI in 1995-10-06 with  stenting to unknown coronary. She remains on ASA 81 mg. No cardiac complaints. She is stable from cardiac standpoint.    2. Hypertension: BP is elevated today but she reports readings have been better. She will monitor at home. If BP is running high may want to go ahead and switch lisinopril to ARB.  3. Osteoarthritis  4. Sciatica  Current medicines are reviewed at length with the patient today.    Peter Martinique MD, [facility]

## 2018-06-26 ENCOUNTER — Ambulatory Visit: Payer: Medicare Other | Admitting: Cardiology

## 2018-06-26 ENCOUNTER — Encounter: Payer: Self-pay | Admitting: Cardiology

## 2018-06-26 ENCOUNTER — Encounter

## 2018-06-26 VITALS — BP 159/59 | HR 73 | Ht 60.0 in | Wt 142.0 lb

## 2018-06-26 DIAGNOSIS — I10 Essential (primary) hypertension: Secondary | ICD-10-CM | POA: Diagnosis not present

## 2018-06-26 DIAGNOSIS — I2510 Atherosclerotic heart disease of native coronary artery without angina pectoris: Secondary | ICD-10-CM | POA: Diagnosis not present

## 2018-08-30 ENCOUNTER — Emergency Department (HOSPITAL_COMMUNITY): Payer: Medicare Other

## 2018-08-30 ENCOUNTER — Emergency Department (HOSPITAL_COMMUNITY)
Admission: EM | Admit: 2018-08-30 | Discharge: 2018-08-30 | Disposition: A | Discharge status: Home / Self Care | Payer: Medicare Other | Attending: Emergency Medicine | Admitting: Emergency Medicine

## 2018-08-30 ENCOUNTER — Encounter (HOSPITAL_COMMUNITY): Payer: Self-pay

## 2018-08-30 ENCOUNTER — Other Ambulatory Visit: Payer: Self-pay

## 2018-08-30 DIAGNOSIS — S52502A Unspecified fracture of the lower end of left radius, initial encounter for closed fracture: Secondary | ICD-10-CM | POA: Diagnosis not present

## 2018-08-30 DIAGNOSIS — Y939 Activity, unspecified: Secondary | ICD-10-CM | POA: Insufficient documentation

## 2018-08-30 DIAGNOSIS — S01511A Laceration without foreign body of lip, initial encounter: Secondary | ICD-10-CM | POA: Diagnosis not present

## 2018-08-30 DIAGNOSIS — N183 Chronic kidney disease, stage 3 (moderate): Secondary | ICD-10-CM | POA: Insufficient documentation

## 2018-08-30 DIAGNOSIS — Y999 Unspecified external cause status: Secondary | ICD-10-CM | POA: Insufficient documentation

## 2018-08-30 DIAGNOSIS — Y929 Unspecified place or not applicable: Secondary | ICD-10-CM | POA: Diagnosis not present

## 2018-08-30 DIAGNOSIS — S52601A Unspecified fracture of lower end of right ulna, initial encounter for closed fracture: Secondary | ICD-10-CM | POA: Diagnosis not present

## 2018-08-30 DIAGNOSIS — R51 Headache: Secondary | ICD-10-CM | POA: Diagnosis not present

## 2018-08-30 DIAGNOSIS — S0083XA Contusion of other part of head, initial encounter: Secondary | ICD-10-CM

## 2018-08-30 DIAGNOSIS — W1839XA Other fall on same level, initial encounter: Secondary | ICD-10-CM | POA: Diagnosis not present

## 2018-08-30 DIAGNOSIS — Z7982 Long term (current) use of aspirin: Secondary | ICD-10-CM | POA: Diagnosis not present

## 2018-08-30 DIAGNOSIS — Z23 Encounter for immunization: Secondary | ICD-10-CM | POA: Insufficient documentation

## 2018-08-30 DIAGNOSIS — W010XXA Fall on same level from slipping, tripping and stumbling without subsequent striking against object, initial encounter: Secondary | ICD-10-CM

## 2018-08-30 DIAGNOSIS — Z79899 Other long term (current) drug therapy: Secondary | ICD-10-CM | POA: Insufficient documentation

## 2018-08-30 DIAGNOSIS — I252 Old myocardial infarction: Secondary | ICD-10-CM | POA: Diagnosis not present

## 2018-08-30 DIAGNOSIS — I129 Hypertensive chronic kidney disease with stage 1 through stage 4 chronic kidney disease, or unspecified chronic kidney disease: Secondary | ICD-10-CM | POA: Insufficient documentation

## 2018-08-30 MED ORDER — TETANUS-DIPHTH-ACELL PERTUSSIS 5-2.5-18.5 LF-MCG/0.5 IM SUSP
0.5000 mL | Freq: Once | INTRAMUSCULAR | Status: AC
  Administered 2018-08-30: 0.5 mL via INTRAMUSCULAR
  Filled 2018-08-30: qty 0.5

## 2018-08-30 MED ORDER — LIDOCAINE-EPINEPHRINE (PF) 2 %-1:200000 IJ SOLN
10.0000 mL | Freq: Once | INTRAMUSCULAR | Status: AC
  Administered 2018-08-30: 10 mL
  Filled 2018-08-30: qty 20

## 2018-08-30 MED ORDER — TRAMADOL HCL 50 MG PO TABS
50.0000 mg | ORAL_TABLET | Freq: Once | ORAL | Status: AC
  Administered 2018-08-30: 50 mg via ORAL
  Filled 2018-08-30: qty 1

## 2018-08-30 MED ORDER — ACETAMINOPHEN 325 MG PO TABS
650.0000 mg | ORAL_TABLET | Freq: Once | ORAL | Status: AC
  Administered 2018-08-30: 650 mg via ORAL
  Filled 2018-08-30: qty 2

## 2018-08-30 NOTE — ED Provider Notes (Signed)
[facility] DEPT Provider Note   CSN: [id number] Arrival date & time: 08/30/18  1525    History   Chief Complaint Chief Complaint  Patient presents with  . Fall  . Wrist Pain    HPI Amber Bender is a 85 y.o. female.     Patient s/p fall just prior to arrival today. Pt reached for car door, fell forward onto outstretched bil hands/wrists. Also hit mouth/head. No loc. Dull frontal headache post fall, and lip laceration. Denies neck or back pain. No numbness/weakness. No radicular pain. bil wrist pain dull, moderate, worse w palpation. Was able to get up/stand post fall. Mild contusion to bil knees. Abrasions to chin. Pt unsure on tetanus.  No faintness or dizziness prior to fall. No anticoag use except asa.   The history is provided by the patient.  Fall  Associated symptoms include headaches. Pertinent negatives include no chest pain, no abdominal pain and no shortness of breath.  Wrist Pain  Associated symptoms include headaches. Pertinent negatives include no chest pain, no abdominal pain and no shortness of breath.    Past Medical History:  Diagnosis Date  . Arthritis   . Cancer ([place])   . Cataract   . CKD (chronic kidney disease), stage III ([place])   . History of heart artery stent   . HLD (hyperlipidemia)   . HTN (hypertension)   . Melanoma ([place])   . Myocardial infarction [facility]) 1997    Patient Active Problem List   Diagnosis Date Noted  . Urinary frequency 04/02/2018  . Hyponatremia 04/02/2018  . Chest pain 10/02/2017  . History of myocardial infarction/prior stent 10/02/2017  . HTN (hypertension) 10/02/2017  . HLD (hyperlipidemia) 10/02/2017  . CKD (chronic kidney disease), stage III ([place]) 10/02/2017    Past Surgical History:  Procedure Laterality Date  . ABDOMINAL HYSTERECTOMY    . APPENDECTOMY    . BREAST SURGERY    . CARDIAC CATHETERIZATION    . EYE SURGERY    . PITUITARY SURGERY     Removal of tumor     OB History    No obstetric history on file.      Home Medications    Prior to Admission medications   Medication Sig Start Date End Date Taking? Authorizing Provider  Acetaminophen (TYLENOL ARTHRITIS PAIN PO) Take 2 tablets by mouth daily as needed (pain).    Yes [provider]  aspirin 81 MG tablet Take 81 mg by mouth at bedtime.    Yes [provider]  cholecalciferol (VITAMIN D) 1000 units tablet Take 1,000 Units by mouth daily.   Yes [provider]  diclofenac sodium (VOLTAREN) 1 % GEL Apply 2 g topically daily as needed (pain).    Yes [provider]  traMADol (ULTRAM) 50 MG tablet Take 1 tablet (50 mg total) by mouth every 6 (six) hours as needed for severe pain. Patient taking differently: Take 50 mg by mouth every 6 (six) hours as needed for moderate pain or severe pain.  10/03/17  Yes [name], MD  gabapentin (NEURONTIN) 100 MG capsule Take 1 capsule (100 mg total) by mouth 3 (three) times daily. Patient not taking: Reported on 08/30/2018 04/03/18 04/03/19  Patrecia Pour, Christean Grief, MD  lisinopril-hydrochlorothiazide (PRINZIDE,ZESTORETIC) 20-12.5 MG per tablet Take 1 tablet by mouth daily.    [provider]  Multiple Vitamins-Minerals (PRESERVISION AREDS 2) CAPS Take 1 capsule by mouth 2 (two) times daily.    [provider]  omeprazole ([place])  20 MG capsule Take 1 capsule (20 mg total) by mouth daily. Patient taking differently: Take 20 mg by mouth daily as needed (acid reflux).  05/30/14   Blanchie Dessert, MD  Polyvinyl Alcohol-Povidone (REFRESH OP) Place 2 drops into both eyes daily as needed (dry eyes).    [provider]  simvastatin (ZOCOR) 20 MG tablet Take 20 mg by mouth every evening.    [provider]    Family History Family History  Problem Relation Age of Onset  . Cancer Mother   . Heart attack Father        died at age 22     Social History Social History   Tobacco Use  . Smoking status:  Never Smoker  . Smokeless tobacco: Never Used  Substance Use Topics  . Alcohol use: Yes    Alcohol/week: 0.0 standard drinks    Comment: occasional  . Drug use: No     Allergies   Erythromycin; Niacin and related; Ciprofloxacin; Septra [sulfamethoxazole-trimethoprim]; and Sulfa antibiotics   Review of Systems Review of Systems  Constitutional: Negative for fever.  HENT: Negative for nosebleeds.   Eyes: Negative for visual disturbance.  Respiratory: Negative for shortness of breath.   Cardiovascular: Negative for chest pain.  Gastrointestinal: Negative for abdominal pain and vomiting.  Genitourinary: Negative for flank pain.  Musculoskeletal: Negative for back pain and neck pain.  Skin: Negative for rash.  Neurological: Positive for headaches. Negative for weakness and numbness.  Hematological: Does not bruise/bleed easily.  Psychiatric/Behavioral: Negative for confusion.     Physical Exam Updated Vital Signs BP (!) 151/73 (BP Location: Left Arm)   Pulse 72   Temp 97.8 F (36.6 C) (Oral)   Resp 17   Ht 1.524 m (5')   Wt 63.5 kg   SpO2 98%   BMI 27.34 kg/m   Physical Exam Vitals signs and nursing note reviewed.  Constitutional:      Appearance: Normal appearance. She is well-developed.  HENT:     Head:     Comments: Contusion to forehead, chin, and laceration 1 cm to mucosal surface lower lip. No nasal septal hematoma. Facial bones grossly intact.     Nose: Nose normal.     Mouth/Throat:     Mouth: Mucous membranes are moist.  Eyes:     General: No scleral icterus.    Conjunctiva/sclera: Conjunctivae normal.     Pupils: Pupils are equal, round, and reactive to light.  Neck:     Musculoskeletal: Normal range of motion and neck supple. No neck rigidity or muscular tenderness.     Trachea: No tracheal deviation.  Cardiovascular:     Rate and Rhythm: Normal rate and regular rhythm.     Pulses: Normal pulses.     Heart sounds: Normal heart sounds. No murmur.  No friction rub. No gallop.   Pulmonary:     Effort: Pulmonary effort is normal. No respiratory distress.     Breath sounds: Normal breath sounds.     Comments: Right chest wall tenderness. Normal chest wall movement. No crepitus.  Chest:     Chest wall: Tenderness present.  Abdominal:     General: Bowel sounds are normal. There is no distension.     Palpations: Abdomen is soft.     Tenderness: There is no abdominal tenderness. There is no guarding.  Genitourinary:    Comments: No cva tenderness.  Musculoskeletal:        General: No swelling.     Comments: CTLS  spine, non tender, aligned, no step off. Good rom bil extremities. Mild contusion bil knees. No focal bony tenderness on lower ext exam. Tenderness right distal ulna and left distal radius. Skin intact to both areas. No focal scaphoid tenderness. Distal pulses palp bil. No other focal bony tenderness.   Skin:    General: Skin is warm and dry.     Findings: No rash.  Neurological:     Mental Status: She is alert.     Comments: Alert, speech normal. Motor intact bil, stre 5/5. sens grossly intact bil.   Psychiatric:        Mood and Affect: Mood normal.      ED Treatments / Results  Labs (all labs ordered are listed, but only abnormal results are displayed) Labs Reviewed - No data to display  EKG None  Radiology Dg Ribs Unilateral W/chest Right  Result Date: 08/30/2018 CLINICAL DATA:  Right anterior chest pain following a fall this afternoon. EXAM: RIGHT RIBS AND CHEST - 3+ VIEW COMPARISON:  04/02/2018. FINDINGS: Normal sized heart. Tortuous aorta. Clear lungs. Stable mild moderate thoracolumbar scoliosis. No fracture or pneumothorax seen. IMPRESSION: No acute abnormality. Electronically Signed   By: Claudie Revering M.D.   On: 08/30/2018 17:30   Dg Wrist Complete Left  Result Date: 08/30/2018 CLINICAL DATA:  Fall today with bilateral wrist pain. EXAM: LEFT WRIST - COMPLETE 3+ VIEW COMPARISON:  None. FINDINGS: Mild  degenerate change of the radiocarpal joint and radial side of the carpal bones as well as the first carpometacarpal joints. There is a subtle transverse fracture of the distal radial metaphysis with very minimal displacement. Remainder the exam is unremarkable. IMPRESSION: Transverse fracture of the distal radial metaphysis with minimal displacement. Electronically Signed   By: Marin Olp M.D.   On: 08/30/2018 16:30   Dg Wrist Complete Right  Result Date: 08/30/2018 CLINICAL DATA:  Fall today with bilateral wrist pain. EXAM: RIGHT WRIST - COMPLETE 3+ VIEW COMPARISON:  None. FINDINGS: Mild degenerate change of the radiocarpal joint and radial side of the carpal bones as well as the first carpometacarpal joints. Suggestion of a minimally displaced oblique fracture of the distal ulnar metaphysis. IMPRESSION: Findings suggesting a minimally displaced oblique fracture of the distal ulnar metaphysis. Electronically Signed   By: Marin Olp M.D.   On: 08/30/2018 16:33   Ct Head Wo Contrast  Result Date: 08/30/2018 CLINICAL DATA:  85 y/o  F; fall with facial injury. EXAM: CT HEAD WITHOUT CONTRAST CT MAXILLOFACIAL WITHOUT CONTRAST TECHNIQUE: Multidetector CT imaging of the head and maxillofacial structures were performed using the standard protocol without intravenous contrast. Multiplanar CT image reconstructions of the maxillofacial structures were also generated. COMPARISON:  10/02/2017 CT head. 02/04/2009 CT maxillofacial. FINDINGS: CT HEAD FINDINGS Brain: No evidence of acute infarction, hemorrhage, hydrocephalus, extra-axial collection or mass lesion/mass effect. Stable chronic microvascular ischemic changes and volume loss of the brain. Vascular: Calcific atherosclerosis of carotid siphons. No hyperdense vessel identified. Skull: Normal. Negative for fracture or focal lesion. Other: None. CT MAXILLOFACIAL FINDINGS Osseous: No fracture or mandibular dislocation. No destructive process. Advanced disc and  facet degenerative changes of the visible cervical spine. Orbits: Negative. No traumatic or inflammatory finding. Sinuses: Small mucous retention cyst in the right maxillary sinus. Paranasal sinuses are otherwise normally aerated as are the mastoid air cells. Soft tissues: Mild swelling of soft tissues overlying the chin. No discrete hematoma. IMPRESSION: 1. No acute intracranial abnormality. Stable chronic microvascular ischemic changes and volume loss of the brain.  2. No acute facial fracture. 3. Mild swelling of soft tissues overlying the chin, possibly contusion. No discrete hematoma. Electronically Signed   By: Kristine Garbe M.D.   On: 08/30/2018 18:07   Ct Maxillofacial Wo Contrast  Result Date: 08/30/2018 CLINICAL DATA:  86 y/o  F; fall with facial injury. EXAM: CT HEAD WITHOUT CONTRAST CT MAXILLOFACIAL WITHOUT CONTRAST TECHNIQUE: Multidetector CT imaging of the head and maxillofacial structures were performed using the standard protocol without intravenous contrast. Multiplanar CT image reconstructions of the maxillofacial structures were also generated. COMPARISON:  10/02/2017 CT head. 02/04/2009 CT maxillofacial. FINDINGS: CT HEAD FINDINGS Brain: No evidence of acute infarction, hemorrhage, hydrocephalus, extra-axial collection or mass lesion/mass effect. Stable chronic microvascular ischemic changes and volume loss of the brain. Vascular: Calcific atherosclerosis of carotid siphons. No hyperdense vessel identified. Skull: Normal. Negative for fracture or focal lesion. Other: None. CT MAXILLOFACIAL FINDINGS Osseous: No fracture or mandibular dislocation. No destructive process. Advanced disc and facet degenerative changes of the visible cervical spine. Orbits: Negative. No traumatic or inflammatory finding. Sinuses: Small mucous retention cyst in the right maxillary sinus. Paranasal sinuses are otherwise normally aerated as are the mastoid air cells. Soft tissues: Mild swelling of soft  tissues overlying the chin. No discrete hematoma. IMPRESSION: 1. No acute intracranial abnormality. Stable chronic microvascular ischemic changes and volume loss of the brain. 2. No acute facial fracture. 3. Mild swelling of soft tissues overlying the chin, possibly contusion. No discrete hematoma. Electronically Signed   By: Kristine Garbe M.D.   On: 08/30/2018 18:07    Procedures .Marland KitchenLaceration Repair Date/Time: 08/30/2018 6:32 PM Performed by: Lajean Saver, MD Authorized by: Lajean Saver, MD   Consent:    Consent given by:  Patient Anesthesia (see MAR for exact dosages):    Anesthesia method:  Local infiltration   Local anesthetic:  Lidocaine 2% w/o epi Laceration details:    Location:  Lip   Lip location:  Lower interior lip   Length (cm):  1.5 Repair type:    Repair type:  Simple Pre-procedure details:    Preparation:  Patient was prepped and draped in usual sterile fashion Exploration:    Wound extent: no foreign bodies/material noted   Treatment:    Area cleansed with:  Saline   Amount of cleaning:  Standard   Irrigation solution:  Sterile saline   Irrigation method:  Syringe   Visualized foreign bodies/material removed: no   Skin repair:    Repair method:  Sutures   Suture size:  4-0   Suture material:  Chromic gut   Suture technique:  Simple interrupted   Number of sutures:  3 Post-procedure details:    Patient tolerance of procedure:  Tolerated well, no immediate complications   (including critical care time)  Medications Ordered in ED Medications  lidocaine-EPINEPHrine (XYLOCAINE W/EPI) 2 %-1:200000 (PF) injection 10 mL (has no administration in time range)     Initial Impression / Assessment and Plan / ED Course  I have reviewed the triage vital signs and the nursing notes.  Pertinent labs & imaging results that were available during my care of the patient were reviewed by me and considered in my medical decision making (see chart for  details).  Imaging studies ordered.  Reviewed nursing notes and prior charts for additional history.   Orthopedics/hand consulted.   xrays reviewed - right distal ulnar fx, left distal radius fx.   Ct reviewed - no acute process.  Tetanus im.   Pt requests pain med,  takes ultram at home.   Ultram po. Acetaminophen po.  pts lip wound had continued to ooze/bleed despite pressure. Wound sutured.   Ice to sore areas.   Hand surgery consulted - discussed with Dr Fredna Dow - he indicates sugar tong on right, wrist splint on left, and f/u office.   Pt lives w family, also has some help at home. Will make referral to increase home health services.   Recheck spine non tender, aligned. Recheck abd soft nt.     Final Clinical Impressions(s) / ED Diagnoses   Final diagnoses:  None    ED Discharge Orders    None       Lajean Saver, MD 08/30/18 [phone]

## 2018-08-30 NOTE — Progress Notes (Signed)
Orthopedic Tech Progress Note Patient Details:  MORGAINE KIMBALL 12/10/1920 [id number] Pt was placed in Lt velcro wrist splint and Rt sugartong splint ordered by MD. Ortho Devices Type of Ortho Device: Ace wrap, Arm sling, Velcro wrist splint, Long arm splint, Sugartong splint Splint Material: Fiberglass Ortho Device/Splint Location: Rt arm sugartong and Lt velcro wrist splint Ortho Device/Splint Interventions: Application   Post Interventions Patient Tolerated: Well Instructions Provided: Adjustment of device, Care of device   Ladell Pier [facility] 08/30/2018, 6:13 PM

## 2018-08-30 NOTE — Discharge Instructions (Addendum)
It was our pleasure to provide your ER care today - we hope that you feel better.  Take acetaminophen as need for pain. You may also take your pain medication as need.   Elevate wrists to help with pain and swelling. Coldpacks to sore areas.   The sutures in your lip will dissolve - there is no need to get them removed.   Follow up with orthopedic/hand specialist in the coming week - see referral - call office tomorrow AM to arrange appointment.   We also made a home health referral to increase your support at home - they should be contacting you tomorrow.   Return to ER if worse, new symptoms, new/severe/intractable pain, trouble breathing, other concern.

## 2018-08-30 NOTE — ED Triage Notes (Signed)
Patient BIB EMS with complaints of trip and fall [place]x [place]15 [place] [employer] [place] and bilateral wrist pain. Patient doesn't think she hit her head, but dried blood is noted on patient lips and laceration to patient nose. Patient has hx of cardiac stent since 1997 and states she takes daily ASA. Patient alert and oriented in triage. Patient denies LOC.

## 2018-08-30 NOTE — ED Notes (Signed)
Bed: [facility] Expected date:  Expected time:  Means of arrival:  Comments: EMS-85 y/o fall

## 2018-08-31 DIAGNOSIS — S52502A Unspecified fracture of the lower end of left radius, initial encounter for closed fracture: Secondary | ICD-10-CM | POA: Insufficient documentation

## 2018-11-14 DIAGNOSIS — M179 Osteoarthritis of knee, unspecified: Secondary | ICD-10-CM | POA: Insufficient documentation

## 2018-11-14 DIAGNOSIS — M1710 Unilateral primary osteoarthritis, unspecified knee: Secondary | ICD-10-CM | POA: Insufficient documentation

## 2018-11-23 ENCOUNTER — Ambulatory Visit: Payer: Medicare Other | Admitting: Podiatry

## 2018-11-23 ENCOUNTER — Encounter: Payer: Self-pay | Admitting: Podiatry

## 2018-11-23 ENCOUNTER — Other Ambulatory Visit: Payer: Self-pay

## 2018-11-23 VITALS — Temp 98.4°F

## 2018-11-23 DIAGNOSIS — R21 Rash and other nonspecific skin eruption: Secondary | ICD-10-CM

## 2018-11-23 DIAGNOSIS — B351 Tinea unguium: Secondary | ICD-10-CM

## 2018-11-23 DIAGNOSIS — M79676 Pain in unspecified toe(s): Secondary | ICD-10-CM

## 2018-11-25 NOTE — Progress Notes (Signed)
Subjective:   Patient ID: Amber Bender, female   DOB: 85 y.o.   MRN: [id number]   HPI 85 year old female presents the office with his daughter for concerns of thick, discolored toenails she cannot trim her self.  She does have the nails become painful with pressure in shoes but denies any redness or drainage or any swelling.  She also had a rash in the outside aspect of her right foot.  She did see 2 other doctors for this and she is on the antibiotic and this is doing much better and almost resolved.  She some dry skin.  No itching.  No other concerns today.   Review of Systems  All other systems reviewed and are negative.  Past Medical History:  Diagnosis Date  . Arthritis   . Cancer ([place])   . Cataract   . CKD (chronic kidney disease), stage III ([place])   . History of heart artery stent   . HLD (hyperlipidemia)   . HTN (hypertension)   . Melanoma ([place])   . Myocardial infarction ([place]) 1997    Past Surgical History:  Procedure Laterality Date  . ABDOMINAL HYSTERECTOMY    . APPENDECTOMY    . BREAST SURGERY    . CARDIAC CATHETERIZATION    . EYE SURGERY    . PITUITARY SURGERY     Removal of tumor     Current Outpatient Medications:  .  Acetaminophen (TYLENOL ARTHRITIS PAIN PO), Take 2 tablets by mouth daily as needed (pain). , Disp: , Rfl:  .  aspirin 81 MG tablet, Take 81 mg by mouth at bedtime. , Disp: , Rfl:  .  cholecalciferol (VITAMIN D) 1000 units tablet, Take 1,000 Units by mouth daily., Disp: , Rfl:  .  diclofenac sodium (VOLTAREN) 1 % GEL, Apply 2 g topically daily as needed (pain). , Disp: , Rfl:  .  gabapentin (NEURONTIN) 100 MG capsule, Take 1 capsule (100 mg total) by mouth 3 (three) times daily., Disp: 90 capsule, Rfl: 0 .  Multiple Vitamins-Minerals (PRESERVISION AREDS 2) CAPS, Take 1 capsule by mouth 2 (two) times daily., Disp: , Rfl:  .  omeprazole (PRILOSEC) 20 MG capsule, Take 1 capsule (20 mg total) by mouth daily., Disp: 14 capsule, Rfl: 0 .  Polyvinyl  Alcohol-Povidone (REFRESH OP), Place 2 drops into both eyes daily as needed (dry eyes)., Disp: , Rfl:  .  simvastatin (ZOCOR) 20 MG tablet, Take 20 mg by mouth every evening., Disp: , Rfl:  .  telmisartan-hydrochlorothiazide (MICARDIS HCT) 40-12.5 MG tablet, Take 1 tablet by mouth every morning., Disp: , Rfl:  .  traMADol (ULTRAM) 50 MG tablet, Take 1 tablet (50 mg total) by mouth every 6 (six) hours as needed for severe pain. (Patient taking differently: Take 50 mg by mouth every 6 (six) hours as needed for moderate pain or severe pain. ), Disp: 14 tablet, Rfl: 0  Allergies  Allergen Reactions  . Erythromycin Other (See Comments)    Stomach cramps  . Niacin And Related Hives  . Ciprofloxacin Rash  . Septra [Sulfamethoxazole-Trimethoprim] Rash  . Sulfa Antibiotics Rash         Objective:  Physical Exam  General: AAO x3, NAD  Dermatological:Nails are hypertrophic, dystrophic, brittle, discolored, elongated 10. No surrounding redness or drainage. Tenderness nails 1-5 bilaterally.  On the lateral aspect of right foot there is a dry patch of skin slight erythematous base there is no itching.  There is no increase in warmth, edema.  No fluctuance or  crepitation malodor.  No ascending cellulitis.  There is no erythema to the toes.  She does get occasional erythema the left big toenail but currently not experiencing any.  No open lesions or pre-ulcerative lesions are identified today.   Vascular: Dorsalis Pedis artery and Posterior Tibial artery pedal pulses are 2/4 bilateral with immedate capillary fill time.. There is no pain with calf compression, swelling, warmth, erythema.   Neruologic: Grossly intact via light touch bilateral.   Musculoskeletal: No gross boney pedal deformities bilateral. No pain, crepitus, or limitation noted with foot and ankle range of motion bilateral. Muscular strength 5/5 in all groups tested bilateral.  Gait: Unassisted, Nonantalgic.       Assessment:    Symptomatic onychomycosis, right foot rash improvement     Plan:  -Treatment options discussed including all alternatives, risks, and complications -Etiology of symptoms were discussed -Nails debrided 10 without complications or bleeding. -Moisturizer for the rash on the right foot.  Overall she states is doing much better since antibiotics and there is currently no signs of infection. -Daily foot inspection -Follow-up in 3 months or sooner if any problems arise. In the meantime, encouraged to call the office with any questions, concerns, change in symptoms.   Celesta Gentile, DPM

## 2018-11-26 ENCOUNTER — Ambulatory Visit: Payer: Medicare Other | Admitting: Podiatry

## 2019-02-08 ENCOUNTER — Other Ambulatory Visit: Payer: Self-pay | Admitting: Internal Medicine

## 2019-02-08 ENCOUNTER — Other Ambulatory Visit (HOSPITAL_COMMUNITY): Payer: Self-pay | Admitting: Internal Medicine

## 2019-06-27 NOTE — Progress Notes (Signed)
Cardiology Office Note   Date:  06/28/2019   ID:  Amber Bender, DOB 1920-12-23, MRN [id number]  PCP:  [name], MD  Cardiologist: Dr. Martinique  Chief Complaint  Patient presents with  . Hypertension  . Coronary Artery Disease     History of Present Illness: Amber Bender is a 85 y.o. female who is seen for follow up coronary artery disease, with history of MI in 1997 with placement to unknown artery, hypertension, dyslipidemia, history of chronic kidney disease stage III and pituitary adenoma status post TSS melanoma and osteoarthritis.  She was seen on consultation on 10/03/2017 by Dr. Mertie Moores during  hospitalization for hypertensive urgency.  She was ruled out for ACS with negative EKG and negative troponin x5.  She was to continue medical therapy.  Due to patient's age no further ischemic testing was planned. See in April in the office and was doing well.   She was seen by our service in September 2019. She presented with severe sciatica pain and elevated BP. She had atypical epigastric and chest pain. Cardiac enzymes were normal. No further work up recommended.   She fell in Feb 2020 fracturing her right ulna and left radius. She does have carpel tunnel syndrome. She reports being admitted at [facility] in April with hyponatremia. Now corrected.   She did have some edema resolved with diuretics and compression hose. She wants to know if she needs to continue. No chest pain or Dyspnea.   Past Medical History:  Diagnosis Date  . Arthritis   . Cancer ([place])   . Cataract   . CKD (chronic kidney disease), stage III   . History of heart artery stent   . HLD (hyperlipidemia)   . HTN (hypertension)   . Melanoma ([place])   . Myocardial infarction ([place]) 1997    Past Surgical History:  Procedure Laterality Date  . ABDOMINAL HYSTERECTOMY    . APPENDECTOMY    . BREAST SURGERY    . CARDIAC CATHETERIZATION    . EYE SURGERY    . PITUITARY SURGERY     Removal of tumor      Current Outpatient Medications  Medication Sig Dispense Refill  . Acetaminophen (TYLENOL ARTHRITIS PAIN PO) Take 2 tablets by mouth daily as needed (pain).     Marland Kitchen aspirin 81 MG tablet Take 81 mg by mouth at bedtime.     . cholecalciferol (VITAMIN D) 1000 units tablet Take 1,000 Units by mouth daily.    . diclofenac sodium (VOLTAREN) 1 % GEL Apply 2 g topically daily as needed (pain).     . Multiple Vitamins-Minerals (PRESERVISION AREDS 2) CAPS Take 1 capsule by mouth 2 (two) times daily.    . simvastatin (ZOCOR) 20 MG tablet Take 20 mg by mouth every evening.    Marland Kitchen telmisartan (MICARDIS) 40 MG tablet Take 40 mg by mouth daily.    . traMADol (ULTRAM) 50 MG tablet Take 1 tablet (50 mg total) by mouth every 6 (six) hours as needed for severe pain. 14 tablet 0  . gabapentin (NEURONTIN) 100 MG capsule Take 1 capsule (100 mg total) by mouth 3 (three) times daily. 90 capsule 0   No current facility-administered medications for this visit.    Allergies:   Erythromycin, Niacin and related, Ciprofloxacin, Septra [sulfamethoxazole-trimethoprim], and Sulfa antibiotics    Social History:  The patient  reports that she has never smoked. She has never used smokeless tobacco. She reports current alcohol use. She reports that she does  not use drugs.   Family History:  The patient's family history includes Cancer in her mother; Heart attack in her father.    ROS: All other systems are reviewed and negative. Unless otherwise mentioned in H&P    PHYSICAL EXAM: VS:  BP 108/60   Pulse 69   Ht 5' (1.524 m)   Wt 151 lb 9.6 oz (68.8 kg)   SpO2 95%   BMI 29.61 kg/m  , BMI Body mass index is 29.61 kg/m. GENERAL:  Well appearing, elderly WF in NAD HEENT:  PERRL, EOMI, sclera are clear. Oropharynx is clear. NECK:  No jugular venous distention, carotid upstroke brisk and symmetric, no bruits, no thyromegaly or adenopathy LUNGS:  Clear to auscultation bilaterally CHEST:  Unremarkable HEART:  RRR,  PMI  not displaced or sustained,S1 and S2 within normal limits, no S3, no S4: no clicks, no rubs, no murmurs ABD:  Soft, nontender. BS +, no masses or bruits. No hepatomegaly, no splenomegaly EXT:  2 + pulses throughout, no edema, no cyanosis no clubbing SKIN:  Warm and dry.  No rashes NEURO:  Alert and oriented x 3. Cranial nerves II through XII intact. PSYCH:  Cognitively intact  Recent Labs: No results found for requested labs within last 8760 hours.    Lipid Panel    Component Value Date/Time   CHOL 122 04/03/2018 0420   TRIG 201 (H) 04/03/2018 0420   HDL 39 (L) 04/03/2018 0420   CHOLHDL 3.1 04/03/2018 0420   VLDL 40 04/03/2018 0420   LDLCALC 43 04/03/2018 0420    dated 02/08/19: CBC and BMET normal   Wt Readings from Last 3 Encounters:  06/28/19 151 lb 9.6 oz (68.8 kg)  08/30/18 140 lb (63.5 kg)  06/26/18 142 lb (64.4 kg)    Ecg today shows NSR with possible inferolateral infarct age undetermined. I have personally reviewed and interpreted this study.   Other studies Reviewed: Echocardiogram 09-Oct-2017  Left ventricle: The cavity size was normal. Wall thickness was   normal. Systolic function was vigorous. The estimated ejection   fraction was in the range of 65% to 70%. Wall motion was normal;   there were no regional wall motion abnormalities. Doppler   parameters are consistent with abnormal left ventricular   relaxation (grade 1 diastolic dysfunction). Doppler parameters   are consistent with high ventricular filling pressure.  Impressions:  - Vigorous LV systolic function; mild diastolic dysfunction;   elevated LV filling pressure.   ASSESSMENT AND PLAN:  1. CAD: MI in 1997 with stenting to unknown coronary. She remains on ASA 81 mg. No cardiac complaints. She is stable from cardiac standpoint.    2. Hypertension: BP is well controlled.   3. Osteoarthritis  4. Edema. Resolved. I think it would be Ok to take Triamterene HCT on a prn basis along with  compression hose. Will keep an eye on her BP.    Peter Martinique MD, [facility]

## 2019-06-28 ENCOUNTER — Encounter: Payer: Self-pay | Admitting: Cardiology

## 2019-06-28 ENCOUNTER — Other Ambulatory Visit: Payer: Self-pay

## 2019-06-28 ENCOUNTER — Encounter (INDEPENDENT_AMBULATORY_CARE_PROVIDER_SITE_OTHER): Payer: Self-pay

## 2019-06-28 ENCOUNTER — Ambulatory Visit: Payer: Medicare Other | Admitting: Cardiology

## 2019-06-28 VITALS — BP 108/60 | HR 69 | Ht 60.0 in | Wt 151.6 lb

## 2019-06-28 DIAGNOSIS — I10 Essential (primary) hypertension: Secondary | ICD-10-CM

## 2019-06-28 DIAGNOSIS — I2510 Atherosclerotic heart disease of native coronary artery without angina pectoris: Secondary | ICD-10-CM

## 2019-07-28 ENCOUNTER — Ambulatory Visit: Payer: Medicare PPO | Attending: Internal Medicine

## 2019-07-28 ENCOUNTER — Encounter: Payer: Self-pay | Admitting: *Deleted

## 2019-07-28 DIAGNOSIS — Z23 Encounter for immunization: Secondary | ICD-10-CM | POA: Insufficient documentation

## 2019-07-28 NOTE — Progress Notes (Unsigned)
   Covid-19 Vaccination Clinic  Name:  Amber Bender    MRN: [id number] DOB: 1921-05-29  07/28/2019  [name] was observed post Covid-19 immunization for 15 minutes without incidence. She was provided with Vaccine Information Sheet and instruction to access the V-Safe system.   [name] was instructed to call 911 with any severe reactions post vaccine: Marland Kitchen Difficulty breathing  . Swelling of your face and throat  . A fast heartbeat  . A bad rash all over your body  . Dizziness and weakness

## 2019-08-17 ENCOUNTER — Ambulatory Visit: Payer: Medicare PPO | Attending: Internal Medicine

## 2019-08-17 DIAGNOSIS — Z23 Encounter for immunization: Secondary | ICD-10-CM

## 2019-08-17 NOTE — Progress Notes (Signed)
   Covid-19 Vaccination Clinic  Name:  Amber Bender    MRN: [id number] DOB: Jul 04, 1921  08/17/2019  Ms. Amber Bender was observed post Covid-19 immunization for 15 minutes without incidence. She was provided with Vaccine Information Sheet and instruction to access the V-Safe system.   Ms. Amber Bender was instructed to call 911 with any severe reactions post vaccine: Marland Kitchen Difficulty breathing  . Swelling of your face and throat  . A fast heartbeat  . A bad rash all over your body  . Dizziness and weakness    Immunizations Administered    Name Date Dose VIS Date Route   Pfizer COVID-19 Vaccine 08/17/2019 12:16 PM 0.3 mL 06/21/2019 Intramuscular   Manufacturer: [place]   Lot: [id number]   [place]: [id number]

## 2019-11-07 DIAGNOSIS — L720 Epidermal cyst: Secondary | ICD-10-CM | POA: Diagnosis not present

## 2019-11-07 DIAGNOSIS — Z85828 Personal history of other malignant neoplasm of skin: Secondary | ICD-10-CM | POA: Diagnosis not present

## 2019-11-07 DIAGNOSIS — D1801 Hemangioma of skin and subcutaneous tissue: Secondary | ICD-10-CM | POA: Diagnosis not present

## 2019-11-07 DIAGNOSIS — Z85820 Personal history of malignant melanoma of skin: Secondary | ICD-10-CM | POA: Diagnosis not present

## 2019-11-07 DIAGNOSIS — L821 Other seborrheic keratosis: Secondary | ICD-10-CM | POA: Diagnosis not present

## 2019-11-08 DIAGNOSIS — M170 Bilateral primary osteoarthritis of knee: Secondary | ICD-10-CM | POA: Diagnosis not present

## 2019-12-18 DIAGNOSIS — R8290 Unspecified abnormal findings in urine: Secondary | ICD-10-CM | POA: Diagnosis not present

## 2019-12-18 DIAGNOSIS — N390 Urinary tract infection, site not specified: Secondary | ICD-10-CM | POA: Diagnosis not present

## 2019-12-18 DIAGNOSIS — N360 Urethral fistula: Secondary | ICD-10-CM | POA: Diagnosis not present

## 2020-01-02 DIAGNOSIS — Z85828 Personal history of other malignant neoplasm of skin: Secondary | ICD-10-CM | POA: Diagnosis not present

## 2020-01-02 DIAGNOSIS — L821 Other seborrheic keratosis: Secondary | ICD-10-CM | POA: Diagnosis not present

## 2020-01-02 DIAGNOSIS — L820 Inflamed seborrheic keratosis: Secondary | ICD-10-CM | POA: Diagnosis not present

## 2020-01-10 DIAGNOSIS — G5602 Carpal tunnel syndrome, left upper limb: Secondary | ICD-10-CM | POA: Diagnosis not present

## 2020-01-10 DIAGNOSIS — G5603 Carpal tunnel syndrome, bilateral upper limbs: Secondary | ICD-10-CM | POA: Diagnosis not present

## 2020-01-24 DIAGNOSIS — R21 Rash and other nonspecific skin eruption: Secondary | ICD-10-CM | POA: Diagnosis not present

## 2020-01-24 DIAGNOSIS — N1832 Chronic kidney disease, stage 3b: Secondary | ICD-10-CM | POA: Diagnosis not present

## 2020-01-24 DIAGNOSIS — I129 Hypertensive chronic kidney disease with stage 1 through stage 4 chronic kidney disease, or unspecified chronic kidney disease: Secondary | ICD-10-CM | POA: Diagnosis not present

## 2020-01-24 DIAGNOSIS — B372 Candidiasis of skin and nail: Secondary | ICD-10-CM | POA: Diagnosis not present

## 2020-01-24 DIAGNOSIS — L0889 Other specified local infections of the skin and subcutaneous tissue: Secondary | ICD-10-CM | POA: Diagnosis not present

## 2020-03-11 DIAGNOSIS — M859 Disorder of bone density and structure, unspecified: Secondary | ICD-10-CM | POA: Diagnosis not present

## 2020-03-11 DIAGNOSIS — E785 Hyperlipidemia, unspecified: Secondary | ICD-10-CM | POA: Diagnosis not present

## 2020-03-18 DIAGNOSIS — I2510 Atherosclerotic heart disease of native coronary artery without angina pectoris: Secondary | ICD-10-CM | POA: Diagnosis not present

## 2020-03-18 DIAGNOSIS — E785 Hyperlipidemia, unspecified: Secondary | ICD-10-CM | POA: Diagnosis not present

## 2020-03-18 DIAGNOSIS — I129 Hypertensive chronic kidney disease with stage 1 through stage 4 chronic kidney disease, or unspecified chronic kidney disease: Secondary | ICD-10-CM | POA: Diagnosis not present

## 2020-03-18 DIAGNOSIS — K219 Gastro-esophageal reflux disease without esophagitis: Secondary | ICD-10-CM | POA: Diagnosis not present

## 2020-03-18 DIAGNOSIS — Z0000 Encounter for general adult medical examination without abnormal findings: Secondary | ICD-10-CM | POA: Diagnosis not present

## 2020-03-18 DIAGNOSIS — I10 Essential (primary) hypertension: Secondary | ICD-10-CM | POA: Diagnosis not present

## 2020-03-18 DIAGNOSIS — R82998 Other abnormal findings in urine: Secondary | ICD-10-CM | POA: Diagnosis not present

## 2020-03-18 DIAGNOSIS — M1990 Unspecified osteoarthritis, unspecified site: Secondary | ICD-10-CM | POA: Diagnosis not present

## 2020-03-18 DIAGNOSIS — N1832 Chronic kidney disease, stage 3b: Secondary | ICD-10-CM | POA: Diagnosis not present

## 2020-03-18 DIAGNOSIS — M5136 Other intervertebral disc degeneration, lumbar region: Secondary | ICD-10-CM | POA: Diagnosis not present

## 2020-03-18 DIAGNOSIS — I872 Venous insufficiency (chronic) (peripheral): Secondary | ICD-10-CM | POA: Diagnosis not present

## 2020-04-03 DIAGNOSIS — Z1212 Encounter for screening for malignant neoplasm of rectum: Secondary | ICD-10-CM | POA: Diagnosis not present

## 2020-04-17 DIAGNOSIS — G5602 Carpal tunnel syndrome, left upper limb: Secondary | ICD-10-CM | POA: Diagnosis not present

## 2020-04-22 DIAGNOSIS — M170 Bilateral primary osteoarthritis of knee: Secondary | ICD-10-CM | POA: Diagnosis not present

## 2020-05-15 DIAGNOSIS — Z85828 Personal history of other malignant neoplasm of skin: Secondary | ICD-10-CM | POA: Diagnosis not present

## 2020-05-15 DIAGNOSIS — L821 Other seborrheic keratosis: Secondary | ICD-10-CM | POA: Diagnosis not present

## 2020-05-15 DIAGNOSIS — B353 Tinea pedis: Secondary | ICD-10-CM | POA: Diagnosis not present

## 2020-05-15 DIAGNOSIS — Z85820 Personal history of malignant melanoma of skin: Secondary | ICD-10-CM | POA: Diagnosis not present

## 2020-05-15 DIAGNOSIS — D1801 Hemangioma of skin and subcutaneous tissue: Secondary | ICD-10-CM | POA: Diagnosis not present

## 2020-06-22 DIAGNOSIS — H353131 Nonexudative age-related macular degeneration, bilateral, early dry stage: Secondary | ICD-10-CM | POA: Diagnosis not present

## 2020-06-29 IMAGING — CR DG WRIST COMPLETE 3+V*L*
4 series · 4 of 4 positions shown · non-contrast
Comparison: None.

CLINICAL DATA: Fall today with bilateral wrist pain.

EXAM:
LEFT WRIST - COMPLETE 3+ VIEW

[x wrist pa left]
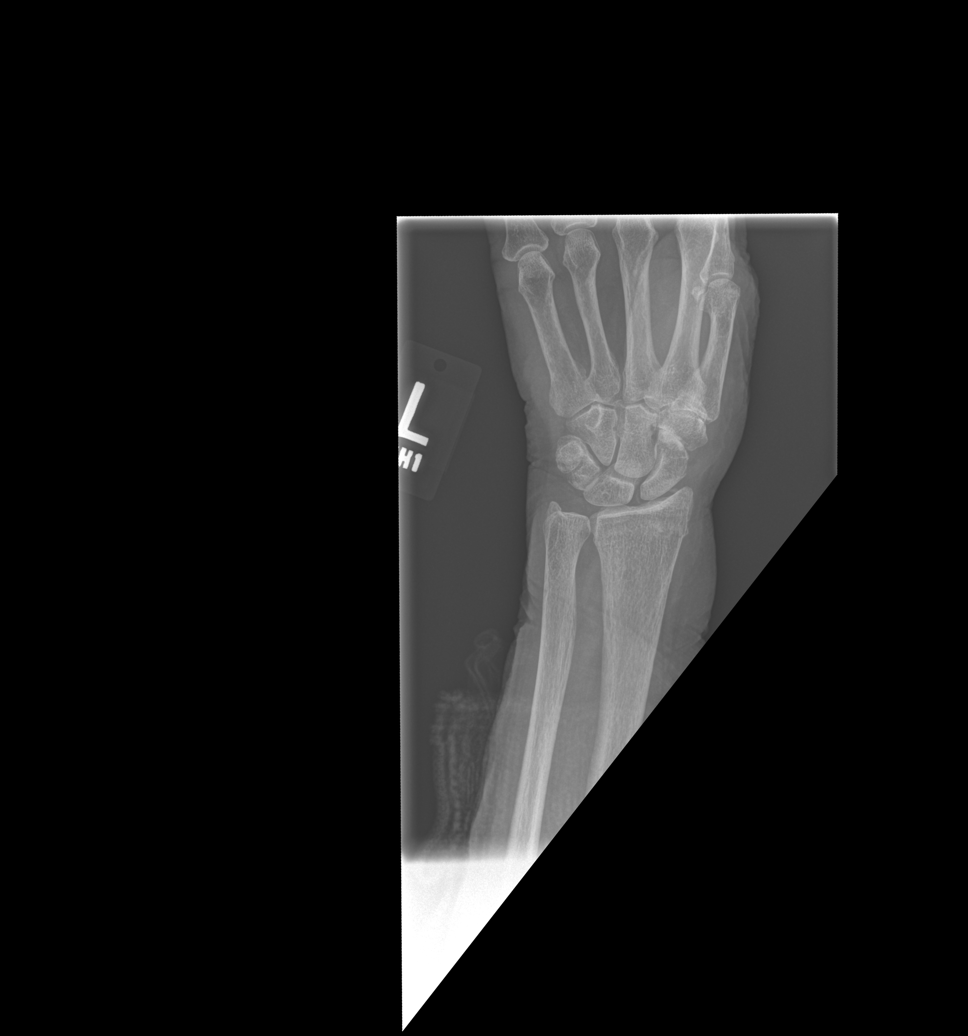

[x wrist obl left]
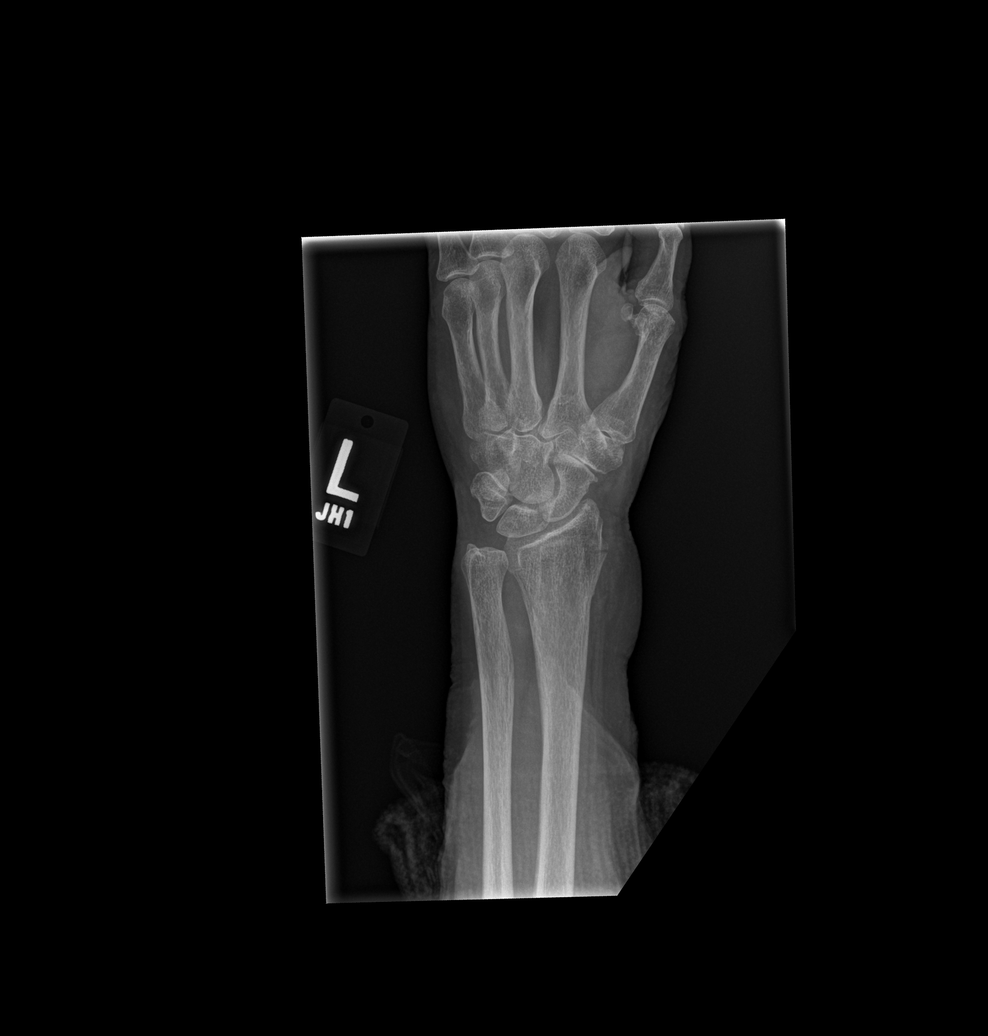

[x wrist lat left]
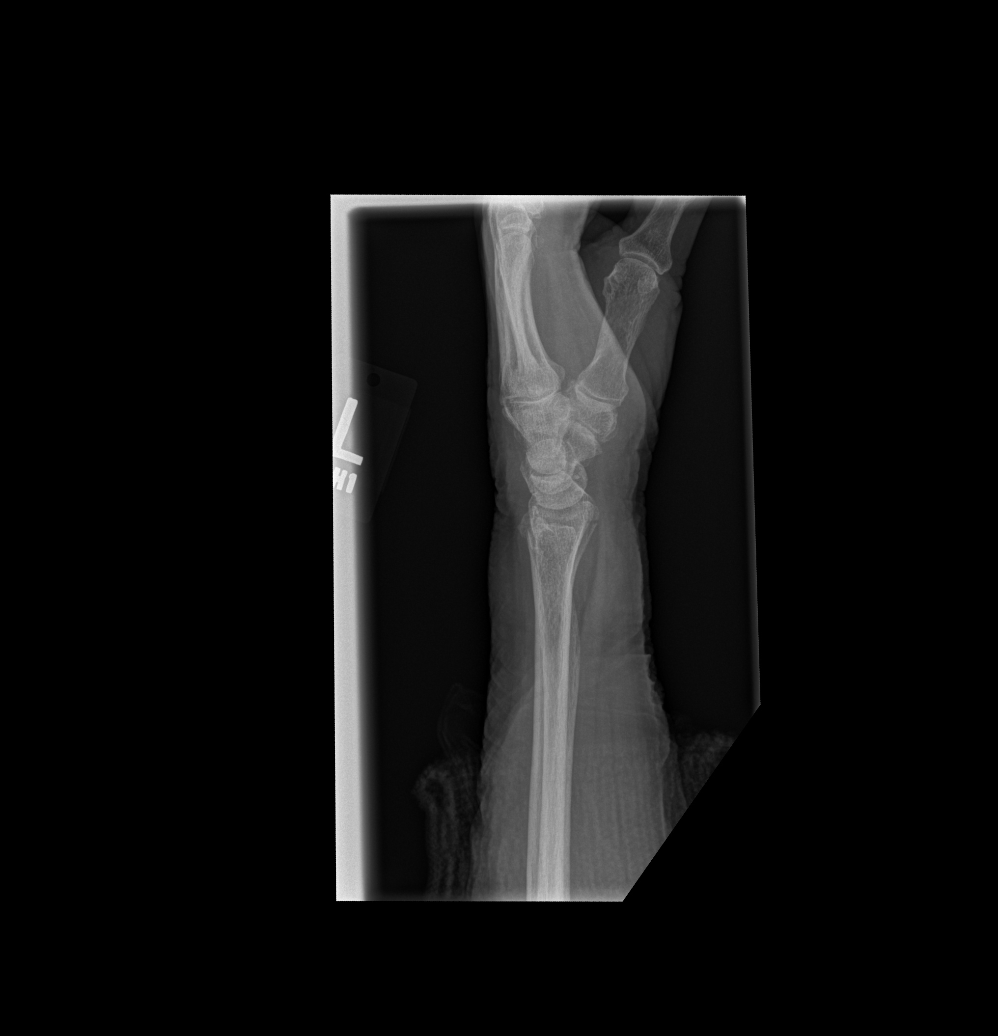

[x wrist navicular view left]
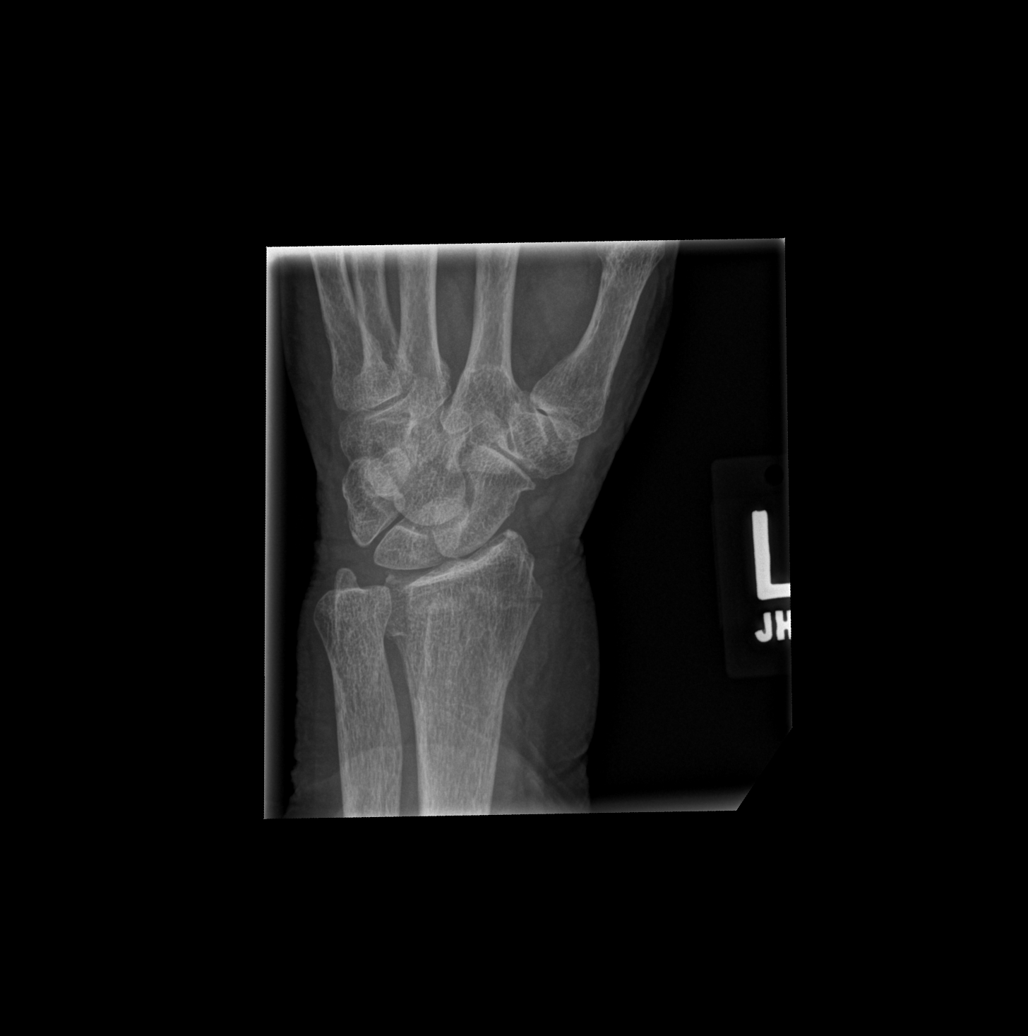

[4 of 4 positions shown; findings below may reference images not displayed]

FINDINGS: Mild degenerate change of the radiocarpal joint and radial side of
the carpal bones as well as the first carpometacarpal joints. There
is a subtle transverse fracture of the distal radial metaphysis with
very minimal displacement. Remainder the exam is unremarkable.
IMPRESSION: Transverse fracture of the distal radial metaphysis with minimal
displacement.

## 2020-08-10 NOTE — Progress Notes (Unsigned)
Cardiology Office Note   Date:  08/14/2020   ID:  Amber Bender, DOB 1920-09-18, MRN [id number]  PCP:  [name], MD  Cardiologist: Dr. Martinique  Chief Complaint  Patient presents with  . Coronary Artery Disease     History of Present Illness: Amber Bender is a 87 y.o. female who is seen for follow up coronary artery disease, with history of MI in 1997 with stent placement to unknown artery, hypertension, dyslipidemia, history of chronic kidney disease stage III and pituitary adenoma status post TSS melanoma and osteoarthritis.  She fell in Feb 2020 fracturing her right ulna and left radius. She does have carpel tunnel syndrome.   She did have some edema resolved with diuretics and compression hose. Tried taking triamterene PRN but is now taking it daily.  No chest pain or Dyspnea.   Past Medical History:  Diagnosis Date  . Arthritis   . Cancer ([place])   . Cataract   . CKD (chronic kidney disease), stage III ([place])   . History of heart artery stent   . HLD (hyperlipidemia)   . HTN (hypertension)   . Melanoma ([place])   . Myocardial infarction ([place]) 1997    Past Surgical History:  Procedure Laterality Date  . ABDOMINAL HYSTERECTOMY    . APPENDECTOMY    . BREAST SURGERY    . CARDIAC CATHETERIZATION    . EYE SURGERY    . PITUITARY SURGERY     Removal of tumor     Current Outpatient Medications  Medication Sig Dispense Refill  . Acetaminophen (TYLENOL ARTHRITIS PAIN PO) Take 2 tablets by mouth daily as needed (pain).     Marland Kitchen aspirin 81 MG tablet Take 81 mg by mouth at bedtime.     . cholecalciferol (VITAMIN D) 1000 units tablet Take 1,000 Units by mouth daily.    . diclofenac sodium (VOLTAREN) 1 % GEL Apply 2 g topically daily as needed (pain).     Marland Kitchen gabapentin (NEURONTIN) 100 MG capsule Take 1 capsule (100 mg total) by mouth 3 (three) times daily. 90 capsule 0  . Multiple Vitamins-Minerals (PRESERVISION AREDS 2) CAPS Take 1 capsule by mouth 2 (two) times daily.    .  simvastatin (ZOCOR) 20 MG tablet Take 20 mg by mouth every evening.    Marland Kitchen telmisartan (MICARDIS) 40 MG tablet Take 40 mg by mouth daily.    . traMADol (ULTRAM) 50 MG tablet Take 1 tablet (50 mg total) by mouth every 6 (six) hours as needed for severe pain. 14 tablet 0  . TRIAMTERENE-HCTZ PO Take by mouth. Does not know the dose     No current facility-administered medications for this visit.    Allergies:   Erythromycin, Niacin and related, Ciprofloxacin, Septra [sulfamethoxazole-trimethoprim], and Sulfa antibiotics    Social History:  The patient  reports that she has never smoked. She has never used smokeless tobacco. She reports current alcohol use. She reports that she does not use drugs.   Family History:  The patient's family history includes Cancer in her mother; Heart attack in her father.    ROS: All other systems are reviewed and negative. Unless otherwise mentioned in H&P    PHYSICAL EXAM: VS:  BP 130/68   Pulse 72   Ht 5' (1.524 m)   Wt 159 lb 6.4 oz (72.3 kg)   BMI 31.13 kg/m  , BMI Body mass index is 31.13 kg/m. GENERAL:  Well appearing, elderly WF in NAD HEENT:  PERRL, EOMI, sclera are  clear. Oropharynx is clear. NECK:  No jugular venous distention, carotid upstroke brisk and symmetric, no bruits, no thyromegaly or adenopathy LUNGS:  Clear to auscultation bilaterally CHEST:  Unremarkable HEART:  RRR,  PMI not displaced or sustained,S1 and S2 within normal limits, no S3, no S4: no clicks, no rubs, no murmurs ABD:  Soft, nontender. BS +, no masses or bruits. No hepatomegaly, no splenomegaly EXT:  2 + pulses throughout, no edema, no cyanosis no clubbing SKIN:  Warm and dry.  No rashes NEURO:  Alert and oriented x 3. Cranial nerves II through XII intact. PSYCH:  Cognitively intact  Recent Labs: No results found for requested labs within last 8760 hours.    Lipid Panel    Component Value Date/Time   CHOL 122 04/03/2018 0420   TRIG 201 (H) 04/03/2018 0420   HDL  39 (L) 04/03/2018 0420   CHOLHDL 3.1 04/03/2018 0420   VLDL 40 04/03/2018 0420   LDLCALC 43 04/03/2018 0420    dated 02/08/19: CBC and BMET normal Dated 09/06/19: creatinine 1.5. BUN 30. BMET otherwise normal.   Wt Readings from Last 3 Encounters:  08/14/20 159 lb 6.4 oz (72.3 kg)  06/28/19 151 lb 9.6 oz (68.8 kg)  08/30/18 140 lb (63.5 kg)    Ecg today shows NSR with possible inferolateral infarct old. I have personally reviewed and interpreted this study.   Other studies Reviewed: Echocardiogram 2017/10/20  Left ventricle: The cavity size was normal. Wall thickness was   normal. Systolic function was vigorous. The estimated ejection   fraction was in the range of 65% to 70%. Wall motion was normal;   there were no regional wall motion abnormalities. Doppler   parameters are consistent with abnormal left ventricular   relaxation (grade 1 diastolic dysfunction). Doppler parameters   are consistent with high ventricular filling pressure.  Impressions:  - Vigorous LV systolic function; mild diastolic dysfunction;   elevated LV filling pressure.   ASSESSMENT AND PLAN:  1. CAD: MI in 1997 with stenting to unknown coronary. She remains on ASA 81 mg. No cardiac complaints. She is stable from cardiac standpoint.    2. Hypertension: BP is well controlled.   3. Edema. Resolved. Well controlled on Triamterene HCT    Peter Martinique MD, [facility]

## 2020-08-14 ENCOUNTER — Other Ambulatory Visit: Payer: Self-pay

## 2020-08-14 ENCOUNTER — Ambulatory Visit: Payer: Medicare PPO | Admitting: Cardiology

## 2020-08-14 ENCOUNTER — Encounter: Payer: Self-pay | Admitting: Cardiology

## 2020-08-14 VITALS — BP 130/68 | HR 72 | Ht 60.0 in | Wt 159.4 lb

## 2020-08-14 DIAGNOSIS — I2510 Atherosclerotic heart disease of native coronary artery without angina pectoris: Secondary | ICD-10-CM | POA: Diagnosis not present

## 2020-08-14 DIAGNOSIS — R609 Edema, unspecified: Secondary | ICD-10-CM

## 2020-08-14 DIAGNOSIS — I10 Essential (primary) hypertension: Secondary | ICD-10-CM

## 2020-09-16 DIAGNOSIS — R2681 Unsteadiness on feet: Secondary | ICD-10-CM | POA: Diagnosis not present

## 2020-09-16 DIAGNOSIS — G5602 Carpal tunnel syndrome, left upper limb: Secondary | ICD-10-CM | POA: Diagnosis not present

## 2020-09-16 DIAGNOSIS — I872 Venous insufficiency (chronic) (peripheral): Secondary | ICD-10-CM | POA: Diagnosis not present

## 2020-09-16 DIAGNOSIS — I2510 Atherosclerotic heart disease of native coronary artery without angina pectoris: Secondary | ICD-10-CM | POA: Diagnosis not present

## 2020-09-16 DIAGNOSIS — N1832 Chronic kidney disease, stage 3b: Secondary | ICD-10-CM | POA: Diagnosis not present

## 2020-09-16 DIAGNOSIS — J302 Other seasonal allergic rhinitis: Secondary | ICD-10-CM | POA: Diagnosis not present

## 2020-09-16 DIAGNOSIS — I129 Hypertensive chronic kidney disease with stage 1 through stage 4 chronic kidney disease, or unspecified chronic kidney disease: Secondary | ICD-10-CM | POA: Diagnosis not present

## 2020-11-05 DIAGNOSIS — Z85820 Personal history of malignant melanoma of skin: Secondary | ICD-10-CM | POA: Diagnosis not present

## 2020-11-05 DIAGNOSIS — Z85828 Personal history of other malignant neoplasm of skin: Secondary | ICD-10-CM | POA: Diagnosis not present

## 2020-11-05 DIAGNOSIS — L814 Other melanin hyperpigmentation: Secondary | ICD-10-CM | POA: Diagnosis not present

## 2020-11-05 DIAGNOSIS — L570 Actinic keratosis: Secondary | ICD-10-CM | POA: Diagnosis not present

## 2020-11-05 DIAGNOSIS — L821 Other seborrheic keratosis: Secondary | ICD-10-CM | POA: Diagnosis not present

## 2020-11-05 DIAGNOSIS — L820 Inflamed seborrheic keratosis: Secondary | ICD-10-CM | POA: Diagnosis not present

## 2020-12-18 DIAGNOSIS — R200 Anesthesia of skin: Secondary | ICD-10-CM | POA: Diagnosis not present

## 2020-12-18 DIAGNOSIS — M79642 Pain in left hand: Secondary | ICD-10-CM | POA: Diagnosis not present

## 2020-12-18 DIAGNOSIS — M79641 Pain in right hand: Secondary | ICD-10-CM | POA: Diagnosis not present

## 2020-12-18 DIAGNOSIS — R202 Paresthesia of skin: Secondary | ICD-10-CM | POA: Diagnosis not present

## 2021-01-13 DIAGNOSIS — M170 Bilateral primary osteoarthritis of knee: Secondary | ICD-10-CM | POA: Diagnosis not present

## 2021-03-03 DIAGNOSIS — G5602 Carpal tunnel syndrome, left upper limb: Secondary | ICD-10-CM | POA: Diagnosis not present

## 2021-03-18 DIAGNOSIS — M859 Disorder of bone density and structure, unspecified: Secondary | ICD-10-CM | POA: Diagnosis not present

## 2021-03-18 DIAGNOSIS — R0781 Pleurodynia: Secondary | ICD-10-CM | POA: Diagnosis not present

## 2021-03-18 DIAGNOSIS — M546 Pain in thoracic spine: Secondary | ICD-10-CM | POA: Diagnosis not present

## 2021-03-18 DIAGNOSIS — E785 Hyperlipidemia, unspecified: Secondary | ICD-10-CM | POA: Diagnosis not present

## 2021-03-24 DIAGNOSIS — Z0000 Encounter for general adult medical examination without abnormal findings: Secondary | ICD-10-CM | POA: Diagnosis not present

## 2021-03-24 DIAGNOSIS — I129 Hypertensive chronic kidney disease with stage 1 through stage 4 chronic kidney disease, or unspecified chronic kidney disease: Secondary | ICD-10-CM | POA: Diagnosis not present

## 2021-03-24 DIAGNOSIS — R82998 Other abnormal findings in urine: Secondary | ICD-10-CM | POA: Diagnosis not present

## 2021-03-24 DIAGNOSIS — R2681 Unsteadiness on feet: Secondary | ICD-10-CM | POA: Diagnosis not present

## 2021-03-24 DIAGNOSIS — R0789 Other chest pain: Secondary | ICD-10-CM | POA: Diagnosis not present

## 2021-03-24 DIAGNOSIS — M5417 Radiculopathy, lumbosacral region: Secondary | ICD-10-CM | POA: Diagnosis not present

## 2021-03-24 DIAGNOSIS — I872 Venous insufficiency (chronic) (peripheral): Secondary | ICD-10-CM | POA: Diagnosis not present

## 2021-03-24 DIAGNOSIS — N1832 Chronic kidney disease, stage 3b: Secondary | ICD-10-CM | POA: Diagnosis not present

## 2021-03-24 DIAGNOSIS — Z1331 Encounter for screening for depression: Secondary | ICD-10-CM | POA: Diagnosis not present

## 2021-03-24 DIAGNOSIS — Z1339 Encounter for screening examination for other mental health and behavioral disorders: Secondary | ICD-10-CM | POA: Diagnosis not present

## 2021-03-24 DIAGNOSIS — K219 Gastro-esophageal reflux disease without esophagitis: Secondary | ICD-10-CM | POA: Diagnosis not present

## 2021-03-24 DIAGNOSIS — I2510 Atherosclerotic heart disease of native coronary artery without angina pectoris: Secondary | ICD-10-CM | POA: Diagnosis not present

## 2021-06-10 DIAGNOSIS — L821 Other seborrheic keratosis: Secondary | ICD-10-CM | POA: Diagnosis not present

## 2021-06-10 DIAGNOSIS — D485 Neoplasm of uncertain behavior of skin: Secondary | ICD-10-CM | POA: Diagnosis not present

## 2021-06-10 DIAGNOSIS — L820 Inflamed seborrheic keratosis: Secondary | ICD-10-CM | POA: Diagnosis not present

## 2021-06-10 DIAGNOSIS — L989 Disorder of the skin and subcutaneous tissue, unspecified: Secondary | ICD-10-CM | POA: Diagnosis not present

## 2021-06-10 DIAGNOSIS — K130 Diseases of lips: Secondary | ICD-10-CM | POA: Diagnosis not present

## 2021-06-10 DIAGNOSIS — D1801 Hemangioma of skin and subcutaneous tissue: Secondary | ICD-10-CM | POA: Diagnosis not present

## 2021-06-10 DIAGNOSIS — L814 Other melanin hyperpigmentation: Secondary | ICD-10-CM | POA: Diagnosis not present

## 2021-06-10 DIAGNOSIS — L720 Epidermal cyst: Secondary | ICD-10-CM | POA: Diagnosis not present

## 2021-06-10 DIAGNOSIS — Z85828 Personal history of other malignant neoplasm of skin: Secondary | ICD-10-CM | POA: Diagnosis not present

## 2021-08-16 DIAGNOSIS — Z85828 Personal history of other malignant neoplasm of skin: Secondary | ICD-10-CM | POA: Diagnosis not present

## 2021-08-16 DIAGNOSIS — D485 Neoplasm of uncertain behavior of skin: Secondary | ICD-10-CM | POA: Diagnosis not present

## 2021-08-29 NOTE — Progress Notes (Signed)
Cardiology Office Note   Date:  09/03/2021   ID:  Amber Bender, DOB [birth year], MRN [id number]  PCP:  Amber Bunting, MD  Cardiologist: Dr. Martinique  Chief Complaint  Patient presents with   Coronary Artery Disease     History of Present Illness: Amber Bender is a 88 y.o. female who is seen for follow up coronary artery disease, with history of MI in 1997 with stent placement to unknown artery, hypertension, dyslipidemia, history of chronic kidney disease stage III and pituitary adenoma status post TSS melanoma and osteoarthritis.  She fell in Feb 2020 fracturing her right ulna and left radius. She does have carpel tunnel syndrome.   She did have some edema resolved improved with triamterene/HCT.  No chest pain. Last fall she had a couple of episodes of acute dyspnea waking her from sleep. This really hasn't recurred.   Past Medical History:  Diagnosis Date   Arthritis    Cancer ([place])    Cataract    CKD (chronic kidney disease), stage III ([place])    History of heart artery stent    HLD (hyperlipidemia)    HTN (hypertension)    Melanoma ([place])    Myocardial infarction ([place]) 1997    Past Surgical History:  Procedure Laterality Date   ABDOMINAL HYSTERECTOMY     APPENDECTOMY     BREAST SURGERY     CARDIAC CATHETERIZATION     EYE SURGERY     PITUITARY SURGERY     Removal of tumor     Current Outpatient Medications  Medication Sig Dispense Refill   Acetaminophen (TYLENOL ARTHRITIS PAIN PO) Take 2 tablets by mouth daily as needed (pain).      aspirin 81 MG tablet Take 81 mg by mouth at bedtime.      cholecalciferol (VITAMIN D) 1000 units tablet Take 1,000 Units by mouth daily.     gabapentin (NEURONTIN) 100 MG capsule Take 1 capsule (100 mg total) by mouth 3 (three) times daily. 90 capsule 0   Multiple Vitamins-Minerals (PRESERVISION AREDS 2) CAPS Take 1 capsule by mouth 2 (two) times daily.     simvastatin (ZOCOR) 20 MG tablet Take 20 mg by mouth every evening.      telmisartan (MICARDIS) 40 MG tablet Take 40 mg by mouth daily.     traMADol (ULTRAM) 50 MG tablet Take 1 tablet (50 mg total) by mouth every 6 (six) hours as needed for severe pain. 14 tablet 0   TRIAMTERENE-HCTZ PO Take by mouth. Does not know the dose     No current facility-administered medications for this visit.    Allergies:   Erythromycin, Niacin and related, Ciprofloxacin, Septra [sulfamethoxazole-trimethoprim], and Sulfa antibiotics    Social History:  The patient  reports that she has never smoked. She has never used smokeless tobacco. She reports current alcohol use. She reports that she does not use drugs.   Family History:  The patient's family history includes Cancer in her mother; Heart attack in her father.    ROS: All other systems are reviewed and negative. Unless otherwise mentioned in H&P    PHYSICAL EXAM: VS:  BP 127/71    Pulse 71    Ht 5' (1.524 m)    Wt 154 lb 9.6 oz (70.1 kg)    SpO2 97%    BMI 30.19 kg/m  , BMI Body mass index is 30.19 kg/m. GENERAL:  Well appearing, elderly WF in NAD HEENT:  PERRL, EOMI, sclera are clear. Oropharynx is clear. NECK:  No jugular venous distention, carotid upstroke brisk and symmetric, no bruits, no thyromegaly or adenopathy LUNGS:  Clear to auscultation bilaterally CHEST:  Unremarkable HEART:  RRR,  PMI not displaced or sustained,S1 and S2 within normal limits, no S3, no S4: no clicks, no rubs, no murmurs ABD:  Soft, nontender. BS +, no masses or bruits. No hepatomegaly, no splenomegaly EXT:  2 + pulses throughout, no edema, no cyanosis no clubbing SKIN:  Warm and dry.  No rashes NEURO:  Alert and oriented x 3. Cranial nerves II through XII intact. PSYCH:  Cognitively intact  Recent Labs: No results found for requested labs within last 8760 hours.    Lipid Panel    Component Value Date/Time   CHOL 122 04/03/2018 0420   TRIG 201 (H) 04/03/2018 0420   HDL 39 (L) 04/03/2018 0420   CHOLHDL 3.1 04/03/2018 0420   VLDL  40 04/03/2018 0420   LDLCALC 43 04/03/2018 0420    dated 02/08/19: CBC and BMET normal Dated 09/06/19: creatinine 1.5. BUN 30. BMET otherwise normal. Dated 03/18/21: cholesterol 144, triglycerides 288, HDL 32, LDL 52. CMET and TSH normal.   Wt Readings from Last 3 Encounters:  09/03/21 154 lb 9.6 oz (70.1 kg)  08/14/20 159 lb 6.4 oz (72.3 kg)  06/28/19 151 lb 9.6 oz (68.8 kg)    Ecg today shows NSR with old inferior infarct. I have personally reviewed and interpreted this study.   Other studies Reviewed: Echocardiogram 24-Oct-2017  Left ventricle: The cavity size was normal. Wall thickness was   normal. Systolic function was vigorous. The estimated ejection   fraction was in the range of 65% to 70%. Wall motion was normal;   there were no regional wall motion abnormalities. Doppler   parameters are consistent with abnormal left ventricular   relaxation (grade 1 diastolic dysfunction). Doppler parameters   are consistent with high ventricular filling pressure.   Impressions:   - Vigorous LV systolic function; mild diastolic dysfunction;   elevated LV filling pressure.   ASSESSMENT AND PLAN:  1. CAD: MI in 1997 with stenting to unknown coronary. She remains on ASA 81 mg. Rare episodes of PND. No angina. Will monitor. OK to try sl Ntg if PND recurs.   2. Hypertension: BP is well controlled.   3. Edema. Resolved. Well controlled on Triamterene HCT    Amber Nierman Martinique MD, [facility]

## 2021-09-03 ENCOUNTER — Encounter: Payer: Self-pay | Admitting: Cardiology

## 2021-09-03 ENCOUNTER — Ambulatory Visit: Payer: Medicare PPO | Admitting: Cardiology

## 2021-09-03 ENCOUNTER — Other Ambulatory Visit: Payer: Self-pay

## 2021-09-03 VITALS — BP 127/71 | HR 71 | Ht 60.0 in | Wt 154.6 lb

## 2021-09-03 DIAGNOSIS — I10 Essential (primary) hypertension: Secondary | ICD-10-CM | POA: Diagnosis not present

## 2021-09-03 DIAGNOSIS — R609 Edema, unspecified: Secondary | ICD-10-CM | POA: Diagnosis not present

## 2021-09-03 DIAGNOSIS — I2510 Atherosclerotic heart disease of native coronary artery without angina pectoris: Secondary | ICD-10-CM

## 2021-09-07 DIAGNOSIS — Z85828 Personal history of other malignant neoplasm of skin: Secondary | ICD-10-CM | POA: Diagnosis not present

## 2021-09-07 DIAGNOSIS — D485 Neoplasm of uncertain behavior of skin: Secondary | ICD-10-CM | POA: Diagnosis not present

## 2021-09-22 DIAGNOSIS — N1832 Chronic kidney disease, stage 3b: Secondary | ICD-10-CM | POA: Diagnosis not present

## 2021-09-22 DIAGNOSIS — D485 Neoplasm of uncertain behavior of skin: Secondary | ICD-10-CM | POA: Diagnosis not present

## 2021-09-22 DIAGNOSIS — R2681 Unsteadiness on feet: Secondary | ICD-10-CM | POA: Diagnosis not present

## 2021-09-22 DIAGNOSIS — I129 Hypertensive chronic kidney disease with stage 1 through stage 4 chronic kidney disease, or unspecified chronic kidney disease: Secondary | ICD-10-CM | POA: Diagnosis not present

## 2021-09-22 DIAGNOSIS — D692 Other nonthrombocytopenic purpura: Secondary | ICD-10-CM | POA: Diagnosis not present

## 2021-09-22 DIAGNOSIS — Z85828 Personal history of other malignant neoplasm of skin: Secondary | ICD-10-CM | POA: Diagnosis not present

## 2022-02-24 DIAGNOSIS — L821 Other seborrheic keratosis: Secondary | ICD-10-CM | POA: Diagnosis not present

## 2022-02-24 DIAGNOSIS — Z85828 Personal history of other malignant neoplasm of skin: Secondary | ICD-10-CM | POA: Diagnosis not present

## 2022-02-24 DIAGNOSIS — L820 Inflamed seborrheic keratosis: Secondary | ICD-10-CM | POA: Diagnosis not present

## 2022-03-23 DIAGNOSIS — E785 Hyperlipidemia, unspecified: Secondary | ICD-10-CM | POA: Diagnosis not present

## 2022-03-23 DIAGNOSIS — N1832 Chronic kidney disease, stage 3b: Secondary | ICD-10-CM | POA: Diagnosis not present

## 2022-03-23 DIAGNOSIS — R7989 Other specified abnormal findings of blood chemistry: Secondary | ICD-10-CM | POA: Diagnosis not present

## 2022-03-23 DIAGNOSIS — I10 Essential (primary) hypertension: Secondary | ICD-10-CM | POA: Diagnosis not present

## 2022-03-30 DIAGNOSIS — Z0000 Encounter for general adult medical examination without abnormal findings: Secondary | ICD-10-CM | POA: Diagnosis not present

## 2022-03-30 DIAGNOSIS — Z1331 Encounter for screening for depression: Secondary | ICD-10-CM | POA: Diagnosis not present

## 2022-03-30 DIAGNOSIS — I129 Hypertensive chronic kidney disease with stage 1 through stage 4 chronic kidney disease, or unspecified chronic kidney disease: Secondary | ICD-10-CM | POA: Diagnosis not present

## 2022-03-30 DIAGNOSIS — N1832 Chronic kidney disease, stage 3b: Secondary | ICD-10-CM | POA: Diagnosis not present

## 2022-03-30 DIAGNOSIS — I2510 Atherosclerotic heart disease of native coronary artery without angina pectoris: Secondary | ICD-10-CM | POA: Diagnosis not present

## 2022-03-30 DIAGNOSIS — M8580 Other specified disorders of bone density and structure, unspecified site: Secondary | ICD-10-CM | POA: Diagnosis not present

## 2022-03-30 DIAGNOSIS — K219 Gastro-esophageal reflux disease without esophagitis: Secondary | ICD-10-CM | POA: Diagnosis not present

## 2022-03-30 DIAGNOSIS — E785 Hyperlipidemia, unspecified: Secondary | ICD-10-CM | POA: Diagnosis not present

## 2022-03-30 DIAGNOSIS — Z1339 Encounter for screening examination for other mental health and behavioral disorders: Secondary | ICD-10-CM | POA: Diagnosis not present

## 2022-03-30 DIAGNOSIS — I69354 Hemiplegia and hemiparesis following cerebral infarction affecting left non-dominant side: Secondary | ICD-10-CM | POA: Diagnosis not present

## 2022-04-23 DIAGNOSIS — Z23 Encounter for immunization: Secondary | ICD-10-CM | POA: Diagnosis not present

## 2022-05-23 DIAGNOSIS — M25531 Pain in right wrist: Secondary | ICD-10-CM | POA: Diagnosis not present

## 2022-06-22 DIAGNOSIS — Z85828 Personal history of other malignant neoplasm of skin: Secondary | ICD-10-CM | POA: Diagnosis not present

## 2022-06-22 DIAGNOSIS — L821 Other seborrheic keratosis: Secondary | ICD-10-CM | POA: Diagnosis not present

## 2022-06-22 DIAGNOSIS — D1801 Hemangioma of skin and subcutaneous tissue: Secondary | ICD-10-CM | POA: Diagnosis not present

## 2022-06-22 DIAGNOSIS — C44622 Squamous cell carcinoma of skin of right upper limb, including shoulder: Secondary | ICD-10-CM | POA: Diagnosis not present

## 2022-06-22 DIAGNOSIS — D485 Neoplasm of uncertain behavior of skin: Secondary | ICD-10-CM | POA: Diagnosis not present

## 2022-06-24 DIAGNOSIS — M79642 Pain in left hand: Secondary | ICD-10-CM | POA: Diagnosis not present

## 2022-06-24 DIAGNOSIS — M1811 Unilateral primary osteoarthritis of first carpometacarpal joint, right hand: Secondary | ICD-10-CM | POA: Diagnosis not present

## 2022-06-24 DIAGNOSIS — M79641 Pain in right hand: Secondary | ICD-10-CM | POA: Diagnosis not present

## 2022-06-24 DIAGNOSIS — G5602 Carpal tunnel syndrome, left upper limb: Secondary | ICD-10-CM | POA: Diagnosis not present

## 2022-06-24 DIAGNOSIS — M25531 Pain in right wrist: Secondary | ICD-10-CM | POA: Diagnosis not present

## 2022-08-31 DIAGNOSIS — L821 Other seborrheic keratosis: Secondary | ICD-10-CM | POA: Diagnosis not present

## 2022-08-31 DIAGNOSIS — L308 Other specified dermatitis: Secondary | ICD-10-CM | POA: Diagnosis not present

## 2022-08-31 DIAGNOSIS — L820 Inflamed seborrheic keratosis: Secondary | ICD-10-CM | POA: Diagnosis not present

## 2022-08-31 DIAGNOSIS — B353 Tinea pedis: Secondary | ICD-10-CM | POA: Diagnosis not present

## 2022-08-31 DIAGNOSIS — Z85828 Personal history of other malignant neoplasm of skin: Secondary | ICD-10-CM | POA: Diagnosis not present

## 2022-09-12 DIAGNOSIS — I129 Hypertensive chronic kidney disease with stage 1 through stage 4 chronic kidney disease, or unspecified chronic kidney disease: Secondary | ICD-10-CM | POA: Diagnosis not present

## 2022-09-12 DIAGNOSIS — N1832 Chronic kidney disease, stage 3b: Secondary | ICD-10-CM | POA: Diagnosis not present

## 2022-09-12 DIAGNOSIS — I2510 Atherosclerotic heart disease of native coronary artery without angina pectoris: Secondary | ICD-10-CM | POA: Diagnosis not present

## 2022-11-12 NOTE — Progress Notes (Deleted)
Cardiology Office Note   Date:  11/12/2022   ID:  JOSELYNE KOLIS, DOB 02-19-1921, MRN [id number]  PCP:  Geoffry Paradise, MD  Cardiologist: Dr. [place]  No chief complaint on file.    History of Present Illness: Amber Bender is a 89 y.o. female who is seen for follow up coronary artery disease, with history of MI in 1997 with stent placement to unknown artery, hypertension, dyslipidemia, history of chronic kidney disease stage III and pituitary adenoma status post TSS melanoma and osteoarthritis.  She fell in Feb 2020 fracturing her right ulna and left radius. She does have carpel tunnel syndrome.   She did have some edema resolved improved with triamterene/HCT.  No chest pain. Last fall she had a couple of episodes of acute dyspnea waking her from sleep. This really hasn't recurred.   Past Medical History:  Diagnosis Date   Arthritis    Cancer ([facility])    Cataract    CKD (chronic kidney disease), stage III ([facility])    History of heart artery stent    HLD (hyperlipidemia)    HTN (hypertension)    Melanoma ([facility])    Myocardial infarction ([facility]) 1997    Past Surgical History:  Procedure Laterality Date   ABDOMINAL HYSTERECTOMY     APPENDECTOMY     BREAST SURGERY     CARDIAC CATHETERIZATION     EYE SURGERY     PITUITARY SURGERY     Removal of tumor     Current Outpatient Medications  Medication Sig Dispense Refill   Acetaminophen (TYLENOL ARTHRITIS PAIN PO) Take 2 tablets by mouth daily as needed (pain).      aspirin 81 MG tablet Take 81 mg by mouth at bedtime.      cholecalciferol (VITAMIN D) 1000 units tablet Take 1,000 Units by mouth daily.     gabapentin (NEURONTIN) 100 MG capsule Take 1 capsule (100 mg total) by mouth 3 (three) times daily. 90 capsule 0   Multiple Vitamins-Minerals (PRESERVISION AREDS 2) CAPS Take 1 capsule by mouth 2 (two) times daily.     simvastatin (ZOCOR) 20 MG tablet Take 20 mg by mouth every evening.     telmisartan (MICARDIS) 40 MG tablet Take 40  mg by mouth daily.     traMADol (ULTRAM) 50 MG tablet Take 1 tablet (50 mg total) by mouth every 6 (six) hours as needed for severe pain. 14 tablet 0   TRIAMTERENE-HCTZ PO Take by mouth. Does not know the dose     No current facility-administered medications for this visit.    Allergies:   Erythromycin, Niacin and related, Ciprofloxacin, Septra [sulfamethoxazole-trimethoprim], and Sulfa antibiotics    Social History:  The patient  reports that she has never smoked. She has never used smokeless tobacco. She reports current alcohol use. She reports that she does not use drugs.   Family History:  The patient's family history includes Cancer in her mother; Heart attack in her father.    ROS: All other systems are reviewed and negative. Unless otherwise mentioned in H&P    PHYSICAL EXAM: VS:  There were no vitals taken for this visit. , BMI There is no height or weight on file to calculate BMI. GENERAL:  Well appearing, elderly WF in NAD HEENT:  PERRL, EOMI, sclera are clear. Oropharynx is clear. NECK:  No jugular venous distention, carotid upstroke brisk and symmetric, no bruits, no thyromegaly or adenopathy LUNGS:  Clear to auscultation bilaterally CHEST:  Unremarkable HEART:  RRR,  PMI  not displaced or sustained,S1 and S2 within normal limits, no S3, no S4: no clicks, no rubs, no murmurs ABD:  Soft, nontender. BS +, no masses or bruits. No hepatomegaly, no splenomegaly EXT:  2 + pulses throughout, no edema, no cyanosis no clubbing SKIN:  Warm and dry.  No rashes NEURO:  Alert and oriented x 3. Cranial nerves II through XII intact. PSYCH:  Cognitively intact  Recent Labs: No results found for requested labs within last 365 days.    Lipid Panel    Component Value Date/Time   CHOL 122 04/03/2018 0420   TRIG 201 (H) 04/03/2018 0420   HDL 39 (L) 04/03/2018 0420   CHOLHDL 3.1 04/03/2018 0420   VLDL 40 04/03/2018 0420   LDLCALC 43 04/03/2018 0420    dated 02/08/19: CBC and BMET  normal Dated 09/06/19: creatinine 1.5. BUN 30. BMET otherwise normal. Dated 03/18/21: cholesterol 144, triglycerides 288, HDL 32, LDL 52. CMET and TSH normal.   Wt Readings from Last 3 Encounters:  09/03/21 154 lb 9.6 oz (70.1 kg)  08/14/20 159 lb 6.4 oz (72.3 kg)  06/28/19 151 lb 9.6 oz (68.8 kg)    Ecg today shows NSR with old inferior infarct. I have personally reviewed and interpreted this study.   Other studies Reviewed: Echocardiogram October 04, 2017  Left ventricle: The cavity size was normal. Wall thickness was   normal. Systolic function was vigorous. The estimated ejection   fraction was in the range of 65% to 70%. Wall motion was normal;   there were no regional wall motion abnormalities. Doppler   parameters are consistent with abnormal left ventricular   relaxation (grade 1 diastolic dysfunction). Doppler parameters   are consistent with high ventricular filling pressure.   Impressions:   - Vigorous LV systolic function; mild diastolic dysfunction;   elevated LV filling pressure.   ASSESSMENT AND PLAN:  1. CAD: MI in 1997 with stenting to unknown coronary. She remains on ASA 81 mg. Rare episodes of PND. No angina. Will monitor. OK to try sl Ntg if PND recurs.   2. Hypertension: BP is well controlled.   3. Edema. Resolved. Well controlled on Triamterene HCT    Mavrick Mcquigg [place] MD, [facility]

## 2022-11-18 ENCOUNTER — Ambulatory Visit: Payer: Medicare PPO | Admitting: Cardiology

## 2022-12-30 DIAGNOSIS — H0015 Chalazion left lower eyelid: Secondary | ICD-10-CM | POA: Diagnosis not present

## 2023-02-10 DIAGNOSIS — H0015 Chalazion left lower eyelid: Secondary | ICD-10-CM | POA: Diagnosis not present

## 2023-03-29 DIAGNOSIS — N1832 Chronic kidney disease, stage 3b: Secondary | ICD-10-CM | POA: Diagnosis not present

## 2023-03-29 DIAGNOSIS — E785 Hyperlipidemia, unspecified: Secondary | ICD-10-CM | POA: Diagnosis not present

## 2023-03-29 DIAGNOSIS — I129 Hypertensive chronic kidney disease with stage 1 through stage 4 chronic kidney disease, or unspecified chronic kidney disease: Secondary | ICD-10-CM | POA: Diagnosis not present

## 2023-03-29 DIAGNOSIS — M8580 Other specified disorders of bone density and structure, unspecified site: Secondary | ICD-10-CM | POA: Diagnosis not present

## 2023-03-29 DIAGNOSIS — I2510 Atherosclerotic heart disease of native coronary artery without angina pectoris: Secondary | ICD-10-CM | POA: Diagnosis not present

## 2023-04-12 DIAGNOSIS — Z0000 Encounter for general adult medical examination without abnormal findings: Secondary | ICD-10-CM | POA: Diagnosis not present

## 2023-04-12 DIAGNOSIS — Z1331 Encounter for screening for depression: Secondary | ICD-10-CM | POA: Diagnosis not present

## 2023-04-12 DIAGNOSIS — I872 Venous insufficiency (chronic) (peripheral): Secondary | ICD-10-CM | POA: Diagnosis not present

## 2023-04-12 DIAGNOSIS — Z1339 Encounter for screening examination for other mental health and behavioral disorders: Secondary | ICD-10-CM | POA: Diagnosis not present

## 2023-04-12 DIAGNOSIS — M8580 Other specified disorders of bone density and structure, unspecified site: Secondary | ICD-10-CM | POA: Diagnosis not present

## 2023-04-12 DIAGNOSIS — I129 Hypertensive chronic kidney disease with stage 1 through stage 4 chronic kidney disease, or unspecified chronic kidney disease: Secondary | ICD-10-CM | POA: Diagnosis not present

## 2023-04-12 DIAGNOSIS — D692 Other nonthrombocytopenic purpura: Secondary | ICD-10-CM | POA: Diagnosis not present

## 2023-04-12 DIAGNOSIS — E785 Hyperlipidemia, unspecified: Secondary | ICD-10-CM | POA: Diagnosis not present

## 2023-04-12 DIAGNOSIS — Z23 Encounter for immunization: Secondary | ICD-10-CM | POA: Diagnosis not present

## 2023-04-12 DIAGNOSIS — I10 Essential (primary) hypertension: Secondary | ICD-10-CM | POA: Diagnosis not present

## 2023-04-12 DIAGNOSIS — R82998 Other abnormal findings in urine: Secondary | ICD-10-CM | POA: Diagnosis not present

## 2023-04-12 DIAGNOSIS — N1832 Chronic kidney disease, stage 3b: Secondary | ICD-10-CM | POA: Diagnosis not present

## 2023-04-12 DIAGNOSIS — K219 Gastro-esophageal reflux disease without esophagitis: Secondary | ICD-10-CM | POA: Diagnosis not present

## 2023-04-12 DIAGNOSIS — I2510 Atherosclerotic heart disease of native coronary artery without angina pectoris: Secondary | ICD-10-CM | POA: Diagnosis not present

## 2023-06-05 DIAGNOSIS — I10 Essential (primary) hypertension: Secondary | ICD-10-CM | POA: Diagnosis not present

## 2023-06-05 DIAGNOSIS — E785 Hyperlipidemia, unspecified: Secondary | ICD-10-CM | POA: Diagnosis not present

## 2023-06-05 DIAGNOSIS — G629 Polyneuropathy, unspecified: Secondary | ICD-10-CM | POA: Diagnosis not present

## 2023-06-05 DIAGNOSIS — I252 Old myocardial infarction: Secondary | ICD-10-CM | POA: Diagnosis not present

## 2023-06-05 DIAGNOSIS — M1990 Unspecified osteoarthritis, unspecified site: Secondary | ICD-10-CM | POA: Diagnosis not present

## 2023-06-05 DIAGNOSIS — R2681 Unsteadiness on feet: Secondary | ICD-10-CM | POA: Diagnosis not present

## 2023-06-05 DIAGNOSIS — M62838 Other muscle spasm: Secondary | ICD-10-CM | POA: Diagnosis not present

## 2023-06-05 DIAGNOSIS — R32 Unspecified urinary incontinence: Secondary | ICD-10-CM | POA: Diagnosis not present

## 2023-06-05 DIAGNOSIS — M8580 Other specified disorders of bone density and structure, unspecified site: Secondary | ICD-10-CM | POA: Diagnosis not present

## 2023-06-22 ENCOUNTER — Emergency Department (HOSPITAL_COMMUNITY)
Admission: EM | Admit: 2023-06-22 | Discharge: 2023-06-23 | Disposition: A | Discharge status: Home / Self Care | Payer: Medicare PPO | Attending: Emergency Medicine | Admitting: Emergency Medicine

## 2023-06-22 ENCOUNTER — Emergency Department (HOSPITAL_COMMUNITY): Payer: Medicare PPO

## 2023-06-22 ENCOUNTER — Other Ambulatory Visit: Payer: Self-pay

## 2023-06-22 DIAGNOSIS — Z79899 Other long term (current) drug therapy: Secondary | ICD-10-CM | POA: Insufficient documentation

## 2023-06-22 DIAGNOSIS — J9811 Atelectasis: Secondary | ICD-10-CM | POA: Diagnosis not present

## 2023-06-22 DIAGNOSIS — R7989 Other specified abnormal findings of blood chemistry: Secondary | ICD-10-CM | POA: Insufficient documentation

## 2023-06-22 DIAGNOSIS — R0602 Shortness of breath: Secondary | ICD-10-CM | POA: Diagnosis not present

## 2023-06-22 DIAGNOSIS — N189 Chronic kidney disease, unspecified: Secondary | ICD-10-CM | POA: Diagnosis not present

## 2023-06-22 DIAGNOSIS — R600 Localized edema: Secondary | ICD-10-CM | POA: Insufficient documentation

## 2023-06-22 DIAGNOSIS — I10 Essential (primary) hypertension: Secondary | ICD-10-CM | POA: Diagnosis not present

## 2023-06-22 DIAGNOSIS — I129 Hypertensive chronic kidney disease with stage 1 through stage 4 chronic kidney disease, or unspecified chronic kidney disease: Secondary | ICD-10-CM | POA: Diagnosis not present

## 2023-06-22 DIAGNOSIS — Z7982 Long term (current) use of aspirin: Secondary | ICD-10-CM | POA: Insufficient documentation

## 2023-06-22 DIAGNOSIS — R5383 Other fatigue: Secondary | ICD-10-CM | POA: Diagnosis not present

## 2023-06-22 DIAGNOSIS — Z85820 Personal history of malignant melanoma of skin: Secondary | ICD-10-CM | POA: Insufficient documentation

## 2023-06-22 DIAGNOSIS — I2510 Atherosclerotic heart disease of native coronary artery without angina pectoris: Secondary | ICD-10-CM | POA: Diagnosis not present

## 2023-06-22 DIAGNOSIS — R0902 Hypoxemia: Secondary | ICD-10-CM | POA: Diagnosis not present

## 2023-06-22 DIAGNOSIS — R0989 Other specified symptoms and signs involving the circulatory and respiratory systems: Secondary | ICD-10-CM | POA: Diagnosis not present

## 2023-06-22 LAB — COMPREHENSIVE METABOLIC PANEL
ALT: 15 U/L (ref 0–44)
AST: 18 U/L (ref 15–41)
Albumin: 4 g/dL (ref 3.5–5.0)
Alkaline Phosphatase: 74 U/L (ref 38–126)
Anion gap: 9 (ref 5–15)
BUN: 44 mg/dL — ABNORMAL HIGH (ref 8–23)
CO2: 22 mmol/L (ref 22–32)
Calcium: 9.5 mg/dL (ref 8.9–10.3)
Chloride: 100 mmol/L (ref 98–111)
Creatinine, Ser: 1.93 mg/dL — ABNORMAL HIGH (ref 0.44–1.00)
GFR, Estimated: 23 mL/min — ABNORMAL LOW (ref >60)
Glucose, Bld: 145 mg/dL — ABNORMAL HIGH (ref 70–99)
Potassium: 4.2 mmol/L (ref 3.5–5.1)
Sodium: 131 mmol/L — ABNORMAL LOW (ref 135–145)
Total Bilirubin: 0.4 mg/dL (ref <1.2)
Total Protein: 6.8 g/dL (ref 6.5–8.1)

## 2023-06-22 LAB — CBC WITH DIFFERENTIAL/PLATELET
Abs Immature Granulocytes: 0.02 10*3/uL (ref 0.00–0.07)
Basophils Absolute: 0.1 10*3/uL (ref 0.0–0.1)
Basophils Relative: 1 %
Eosinophils Absolute: 0.3 10*3/uL (ref 0.0–0.5)
Eosinophils Relative: 3 %
HCT: 38.7 % (ref 36.0–46.0)
Hemoglobin: 13.1 g/dL (ref 12.0–15.0)
Immature Granulocytes: 0 %
Lymphocytes Relative: 24 %
Lymphs Abs: 1.9 10*3/uL (ref 0.7–4.0)
MCH: 30.8 pg (ref 26.0–34.0)
MCHC: 33.9 g/dL (ref 30.0–36.0)
MCV: 91.1 fL (ref 80.0–100.0)
Monocytes Absolute: 0.7 10*3/uL (ref 0.1–1.0)
Monocytes Relative: 9 %
Neutro Abs: 5 10*3/uL (ref 1.7–7.7)
Neutrophils Relative %: 63 %
Platelets: 233 10*3/uL (ref 150–400)
RBC: 4.25 MIL/uL (ref 3.87–5.11)
RDW: 13.8 % (ref 11.5–15.5)
WBC: 7.9 10*3/uL (ref 4.0–10.5)
nRBC: 0 % (ref 0.0–0.2)

## 2023-06-22 LAB — MAGNESIUM: Magnesium: 2.5 mg/dL — ABNORMAL HIGH (ref 1.7–2.4)

## 2023-06-22 LAB — URINALYSIS, W/ REFLEX TO CULTURE (INFECTION SUSPECTED)
Bilirubin Urine: NEGATIVE
Glucose, UA: NEGATIVE mg/dL
Hgb urine dipstick: NEGATIVE
Ketones, ur: NEGATIVE mg/dL
Nitrite: POSITIVE — AB
Protein, ur: NEGATIVE mg/dL
Specific Gravity, Urine: 1.011 (ref 1.005–1.030)
pH: 6 (ref 5.0–8.0)

## 2023-06-22 LAB — TSH: TSH: 2.688 u[IU]/mL (ref 0.350–4.500)

## 2023-06-22 LAB — AMMONIA: Ammonia: 32 umol/L (ref 9–35)

## 2023-06-22 LAB — TROPONIN I (HIGH SENSITIVITY): Troponin I (High Sensitivity): 12 ng/L (ref <18)

## 2023-06-22 LAB — BRAIN NATRIURETIC PEPTIDE: B Natriuretic Peptide: 83.2 pg/mL (ref 0.0–100.0)

## 2023-06-22 MED ORDER — SODIUM CHLORIDE 0.9 % IV BOLUS
500.0000 mL | Freq: Once | INTRAVENOUS | Status: AC
  Administered 2023-06-22: 500 mL via INTRAVENOUS

## 2023-06-22 NOTE — ED Provider Notes (Signed)
[place] EMERGENCY DEPARTMENT AT [facility] Provider Note   CSN: [id number] Arrival date & time: 06/22/23  2104     History  Chief Complaint  Patient presents with   Shortness of Breath    Amber Bender is a 89 y.o. female.  The history is provided by the patient, a relative and medical records. No language interpreter was used.  Shortness of Breath Severity:  Moderate Onset quality:  Gradual Duration:  1 hour Timing:  Rare Progression:  Resolved Chronicity:  New Context: not URI   Relieved by:  Nothing Worsened by:  Nothing Ineffective treatments:  None tried Associated symptoms: cough (chronic and unchanged)   Associated symptoms: no abdominal pain, no chest pain, no diaphoresis, no fever, no headaches, no neck pain, no rash, no sputum production, no vomiting and no wheezing        Home Medications Prior to Admission medications   Medication Sig Start Date End Date Taking? Authorizing Provider  Acetaminophen (TYLENOL ARTHRITIS PAIN PO) Take 2 tablets by mouth daily as needed (pain).     [provider]  aspirin 81 MG tablet Take 81 mg by mouth at bedtime.     [provider]  cholecalciferol (VITAMIN D) 1000 units tablet Take 1,000 Units by mouth daily.    [provider]  gabapentin (NEURONTIN) 100 MG capsule Take 1 capsule (100 mg total) by mouth 3 (three) times daily. 04/03/18 09/03/21  Lenox Ponds, MD  Multiple Vitamins-Minerals (PRESERVISION AREDS 2) CAPS Take 1 capsule by mouth 2 (two) times daily.    [provider]  simvastatin (ZOCOR) 20 MG tablet Take 20 mg by mouth every evening.    [provider]  telmisartan (MICARDIS) 40 MG tablet Take 40 mg by mouth daily.    [provider]  traMADol (ULTRAM) 50 MG tablet Take 1 tablet (50 mg total) by mouth every 6 (six) hours as needed for severe pain. 10/03/17   Glade Lloyd, MD  TRIAMTERENE-HCTZ PO Take by mouth. Does not know the dose     [provider]      Allergies    Erythromycin, Niacin and related, Ciprofloxacin, Septra [sulfamethoxazole-trimethoprim], and Sulfa antibiotics    Review of Systems   Review of Systems  Constitutional:  Positive for fatigue. Negative for chills, diaphoresis and fever.  HENT:  Negative for congestion.   Eyes:  Negative for visual disturbance.  Respiratory:  Positive for cough (chronic and unchanged), chest tightness and shortness of breath. Negative for sputum production, wheezing and stridor.   Cardiovascular:  Positive for leg swelling (chronic and mild and unchanged per pt). Negative for chest pain and palpitations.  Gastrointestinal:  Negative for abdominal pain, constipation, diarrhea, nausea and vomiting.  Genitourinary:  Negative for dysuria and flank pain.  Musculoskeletal:  Negative for back pain, neck pain and neck stiffness.  Skin:  Negative for rash and wound.  Neurological:  Positive for tremors and light-headedness. Negative for dizziness, numbness and headaches.  Psychiatric/Behavioral:  Negative for agitation and confusion.   All other systems reviewed and are negative.   Physical Exam Updated Vital Signs BP (!) 153/66 (BP Location: Right Arm)   Pulse 94   Temp 97.7 F (36.5 C) (Oral)   Resp 18   SpO2 95%  Physical Exam Vitals and nursing note reviewed.  Constitutional:      General: She is not in acute distress.    Appearance: She is well-developed. She is not ill-appearing, toxic-appearing or diaphoretic.  HENT:     Head: Normocephalic and atraumatic.  Eyes:     Conjunctiva/sclera: Conjunctivae normal.     Pupils: Pupils are equal, round, and reactive to light.  Cardiovascular:     Rate and Rhythm: Normal rate and regular rhythm.     Heart sounds: No murmur heard. Pulmonary:     Effort: Pulmonary effort is normal. No tachypnea or respiratory distress.     Breath sounds: Normal breath sounds. No decreased breath sounds, wheezing, rhonchi or  rales.  Chest:     Chest wall: No tenderness.  Abdominal:     Palpations: Abdomen is soft.     Tenderness: There is no abdominal tenderness.  Musculoskeletal:        General: No swelling.     Cervical back: Neck supple.     Right lower leg: No tenderness. Edema present.     Left lower leg: No tenderness. Edema present.  Skin:    General: Skin is warm and dry.     Capillary Refill: Capillary refill takes less than 2 seconds.     Findings: No erythema.  Neurological:     General: No focal deficit present.     Mental Status: She is alert.  Psychiatric:        Mood and Affect: Mood normal.     ED Results / Procedures / Treatments   Labs (all labs ordered are listed, but only abnormal results are displayed) Labs Reviewed  COMPREHENSIVE METABOLIC PANEL - Abnormal; Notable for the following components:      Result Value   Sodium 131 (*)    Glucose, Bld 145 (*)    BUN 44 (*)    Creatinine, Ser 1.93 (*)    GFR, Estimated 23 (*)    All other components within normal limits  MAGNESIUM - Abnormal; Notable for the following components:   Magnesium 2.5 (*)    All other components within normal limits  CBC WITH DIFFERENTIAL/PLATELET  BRAIN NATRIURETIC PEPTIDE  AMMONIA  TSH  URINALYSIS, W/ REFLEX TO CULTURE (INFECTION SUSPECTED)  TROPONIN I (HIGH SENSITIVITY)    EKG EKG Interpretation Date/Time:  Thursday June 22 2023 22:02:31 EST Ventricular Rate:  73 PR Interval:  190 QRS Duration:  87 QT Interval:  399 QTC Calculation: 440 R Axis:   -15  Text Interpretation: Sinus rhythm Inferoposterior infarct, old Consider anterolateral infarct when compared to proir, similar appearance. No STEMI Confirmed by Theda Belfast ([id number]) on 06/22/2023 10:12:47 PM  Radiology DG Chest Port 1 View Result Date: 06/22/2023 CLINICAL DATA:  Shortness of breath. EXAM: PORTABLE CHEST 1 VIEW COMPARISON:  One-view chest x-ray 08/30/2018 FINDINGS: Reversed lordotic angulation is noted. The heart  size is normal. Moderate pulmonary vascular congestion is present bilaterally. Mild bibasilar atelectasis is present airspace consolidation is present. No definite effusions are present although portions of the right hemidiaphragm are excluded. IMPRESSION: 1. Moderate pulmonary vascular congestion without frank edema. 2. Mild bibasilar atelectasis. Electronically Signed   By: Marin Roberts M.D.   On: 06/22/2023 22:06    Procedures Procedures    Medications Ordered in ED Medications  sodium chloride 0.9 % bolus 500 mL (has no administration in time range)    ED Course/ Medical Decision Making/ A&P                                 Medical Decision Making Amount and/or Complexity of Data Reviewed Labs: ordered.  Amber Bender is a 89 y.o. female with a past medical history significant for hypertension, hyperlipidemia, CKD, CAD with previous MI and PCI, previous melanoma who presents with an episode of severe fatigue, lightheadedness, chest tightness with shortness of breath, tremors, and malaise.  Patient reports that she had felt fairly well today until this evening she had an episode lasting an hour or 2 of this profound fatigue.  She reports no chest pain or palpitations but had shortness of breath and chest tightness.  She reports he chronically has a cough that does not seem different.  She reports no chest pain or palpitations.  No reported history of blood clots.  She has some chronic edema in her legs that family thinks is similar.  Patient said she was very jittery and felt that something was very wrong and called for help.  She denies any dysuria or urinary changes.  Denies constipation but had diarrhea yesterday.  He took some Pepto-Bismol yesterday to help with the diarrhea.  She thought she was eating and drinking fairly well today.  Denies recent medication changes otherwise.  On exam, lungs clear.  Chest nontender.  No murmur.  Abdomen nontender.  No flank tenderness.   Intact sensation strength and pulses in extremities.  Symmetric smile.  Clear speech.  No significant tremor initially.  Patient well-appearing and reports that she feels back to baseline now.  Given the patient's age of 89 years old, this transient severe fatigue, lightheadedness, shortness of breath, and malaise, I do feel need she is workup with labs.  With her chronic edema we will get a BNP to make sure there is not some fluid overload component of her shortness of breath.  Will check urinalysis to look for UTI as cause of fatigue.  Will check other labs as well.  Will get troponin given her shortness of breath and history of MI with stenting.  EKG did not show STEMI and her chest x-ray showed some pulmonary vascular congestion but did not comment on significant edema tonight.  As patient is feeling well, if her workup is reassuring, she would like to go home.  Will wait for other labs and reassess.  11:23 PM Labs showed likely AKI.  She will get some fluids.  She will wait for the troponin and delta troponin and labs.  If workup reassuring, anticipate discharge home.  Patient family agree with this plan.  Care transferred Dr. Preston Fleeting while awaiting for rehydration and labs.        Final Clinical Impression(s) / ED Diagnoses Final diagnoses:  Elevated serum creatinine  Shortness of breath  Fatigue, unspecified type    Clinical Impression: 1. Elevated serum creatinine   2. Shortness of breath   3. Fatigue, unspecified type     Disposition: Care transferred Dr. Preston Fleeting while awaiting for rehydration and labs.  This note was prepared with assistance of Conservation officer, historic buildings. Occasional wrong-word or sound-a-like substitutions may have occurred due to the inherent limitations of voice recognition software.       Zeanna Sunde, Canary Brim, MD 06/22/23 [phone]

## 2023-06-22 NOTE — ED Provider Notes (Signed)
Care assumed from Dr. Rush Landmark, patient with transient episode of weakness and shortness of breath. She is pending troponin x2, BNP, magnesium. Anticipate discharge if negative.  I have reviewed her laboratory test, and troponin is normal x 2, magnesium borderline elevated but not felt to be clinically significant.  BNP is normal.  Patient continues to be symptom-free.  I feel she is safe for discharge.  She is to follow-up with her primary care provider, return for new or concerning symptoms.  Results for orders placed or performed during the hospital encounter of 06/22/23  CBC with Differential/Platelet   Collection Time: 06/22/23 10:07 PM  Result Value Ref Range   WBC 7.9 4.0 - 10.5 K/uL   RBC 4.25 3.87 - 5.11 MIL/uL   Hemoglobin 13.1 12.0 - 15.0 g/dL   HCT 16.1 09.6 - 04.5 %   MCV 91.1 80.0 - 100.0 fL   MCH 30.8 26.0 - 34.0 pg   MCHC 33.9 30.0 - 36.0 g/dL   RDW 40.9 81.1 - 91.4 %   Platelets 233 150 - 400 K/uL   nRBC 0.0 0.0 - 0.2 %   Neutrophils Relative % 63 %   Neutro Abs 5.0 1.7 - 7.7 K/uL   Lymphocytes Relative 24 %   Lymphs Abs 1.9 0.7 - 4.0 K/uL   Monocytes Relative 9 %   Monocytes Absolute 0.7 0.1 - 1.0 K/uL   Eosinophils Relative 3 %   Eosinophils Absolute 0.3 0.0 - 0.5 K/uL   Basophils Relative 1 %   Basophils Absolute 0.1 0.0 - 0.1 K/uL   Immature Granulocytes 0 %   Abs Immature Granulocytes 0.02 0.00 - 0.07 K/uL  Comprehensive metabolic panel   Collection Time: 06/22/23 10:07 PM  Result Value Ref Range   Sodium 131 (L) 135 - 145 mmol/L   Potassium 4.2 3.5 - 5.1 mmol/L   Chloride 100 98 - 111 mmol/L   CO2 22 22 - 32 mmol/L   Glucose, Bld 145 (H) 70 - 99 mg/dL   BUN 44 (H) 8 - 23 mg/dL   Creatinine, Ser 7.82 (H) 0.44 - 1.00 mg/dL   Calcium 9.5 8.9 - 95.6 mg/dL   Total Protein 6.8 6.5 - 8.1 g/dL   Albumin 4.0 3.5 - 5.0 g/dL   AST 18 15 - 41 U/L   ALT 15 0 - 44 U/L   Alkaline Phosphatase 74 38 - 126 U/L   Total Bilirubin 0.4 <1.2 mg/dL   GFR, Estimated 23 (L)  >60 mL/min   Anion gap 9 5 - 15  Brain natriuretic peptide   Collection Time: 06/22/23 10:07 PM  Result Value Ref Range   B Natriuretic Peptide 83.2 0.0 - 100.0 pg/mL  Magnesium   Collection Time: 06/22/23 10:07 PM  Result Value Ref Range   Magnesium 2.5 (H) 1.7 - 2.4 mg/dL  Troponin I (High Sensitivity)   Collection Time: 06/22/23 10:07 PM  Result Value Ref Range   Troponin I (High Sensitivity) 12 <18 ng/L  TSH   Collection Time: 06/22/23 10:34 PM  Result Value Ref Range   TSH 2.688 0.350 - 4.500 uIU/mL  Ammonia   Collection Time: 06/22/23 10:34 PM  Result Value Ref Range   Ammonia 32 9 - 35 umol/L  Urinalysis, w/ Reflex to Culture (Infection Suspected) -Urine, Clean Catch   Collection Time: 06/22/23 11:31 PM  Result Value Ref Range   Specimen Source URINE, CLEAN CATCH    Color, Urine YELLOW YELLOW   APPearance CLEAR CLEAR   Specific  Gravity, Urine 1.011 1.005 - 1.030   pH 6.0 5.0 - 8.0   Glucose, UA NEGATIVE NEGATIVE mg/dL   Hgb urine dipstick NEGATIVE NEGATIVE   Bilirubin Urine NEGATIVE NEGATIVE   Ketones, ur NEGATIVE NEGATIVE mg/dL   Protein, ur NEGATIVE NEGATIVE mg/dL   Nitrite POSITIVE (A) NEGATIVE   Leukocytes,Ua MODERATE (A) NEGATIVE   RBC / HPF 0-5 0 - 5 RBC/hpf   WBC, UA 21-50 0 - 5 WBC/hpf   Bacteria, UA RARE (A) NONE SEEN   Squamous Epithelial / HPF 0-5 0 - 5 /HPF  Troponin I (High Sensitivity)   Collection Time: 06/23/23 12:51 AM  Result Value Ref Range   Troponin I (High Sensitivity) 15 <18 ng/L   DG Chest Port 1 View Result Date: 06/22/2023 CLINICAL DATA:  Shortness of breath. EXAM: PORTABLE CHEST 1 VIEW COMPARISON:  One-view chest x-ray 08/30/2018 FINDINGS: Reversed lordotic angulation is noted. The heart size is normal. Moderate pulmonary vascular congestion is present bilaterally. Mild bibasilar atelectasis is present airspace consolidation is present. No definite effusions are present although portions of the right hemidiaphragm are excluded.  IMPRESSION: 1. Moderate pulmonary vascular congestion without frank edema. 2. Mild bibasilar atelectasis. Electronically Signed   By: Marin Roberts M.D.   On: 06/22/2023 22:06      Dione Booze, MD 06/23/23 0140

## 2023-06-22 NOTE — ED Triage Notes (Signed)
Pt arrives EMS from home where she lives with her son. Pt reports she came in due to feeling SHOB at that time. Pt reports it came on all of the sudden and denies any SHOB currently. Pt denies pain but reports feeling weak. Pt resp even and unlabored. Pt oriented x3.

## 2023-06-23 LAB — TROPONIN I (HIGH SENSITIVITY): Troponin I (High Sensitivity): 15 ng/L (ref <18)

## 2023-06-23 NOTE — Discharge Instructions (Addendum)
Make sure you are drinking enough fluids.  Return to the emergency department if you have any new or concerning symptoms.

## 2023-06-24 LAB — URINE CULTURE: Culture: >=100000 — AB

## 2023-06-25 ENCOUNTER — Telehealth (HOSPITAL_BASED_OUTPATIENT_CLINIC_OR_DEPARTMENT_OTHER): Payer: Self-pay | Admitting: *Deleted

## 2023-06-25 NOTE — Telephone Encounter (Signed)
Post ED Visit - Positive Culture Follow-up  Culture report reviewed by antimicrobial stewardship pharmacist: [facility] []  Enzo Bi, Pharm.D. []  Celedonio Miyamoto, Pharm.D., BCPS AQ-ID []  Garvin Fila, Pharm.D., BCPS []  Georgina Pillion, Pharm.D., BCPS []  [place], [address].D., BCPS, AAHIVP []  Estella Husk, Pharm.D., BCPS, AAHIVP []  Lysle Pearl, PharmD, BCPS []  Phillips Climes, PharmD, BCPS []  Agapito Games, PharmD, BCPS []  Verlan Friends, PharmD []  Mervyn Gay, PharmD, BCPS []  Vinnie Level, PharmD  [facility] []  Len Childs, PharmD []  Greer Pickerel, PharmD []  Adalberto Cole, PharmD []  Perlie Gold, Rph []  Lonell Face) Jean Rosenthal, PharmD []  Earl Many, PharmD []  Junita Push, PharmD []  Dorna Leitz, PharmD []  Terrilee Files, PharmD []  Lynann Beaver, PharmD []  Keturah Barre, PharmD []  Loralee Pacas, PharmD [x]  Laureen Ochs, PharmD   Positive urine culture Spoke to pts son and he stated pt is doing well and no further patient follow-up is required at this time per [name], MD  Amber Bender 06/25/2023, 9:03 AM

## 2023-06-25 NOTE — Progress Notes (Signed)
ED Antimicrobial Stewardship Positive Culture Follow Up   Amber Bender is an 89 y.o. female who presented to [facility] on 06/22/2023 with a chief complaint of  Chief Complaint  Patient presents with   Shortness of Breath    Recent Results (from the past 720 hours)  Urine Culture     Status: Abnormal   Collection Time: 06/22/23 11:31 PM   Specimen: Urine, Random  Result Value Ref Range Status   Specimen Description   Final    URINE, RANDOM Performed at [facility], [address], [place], [place] [address]    Special Requests   Final    NONE Reflexed from [id number] Performed at [facility], [address], [place], [place] [address]    Culture >=100,000 COLONIES/mL ESCHERICHIA COLI (A)  Final   Report Status 06/24/2023 FINAL  Final   Organism ID, Bacteria ESCHERICHIA COLI (A)  Final      Susceptibility   Escherichia coli - MIC*    AMPICILLIN <=2 SENSITIVE Sensitive     CEFAZOLIN <=4 SENSITIVE Sensitive     CEFEPIME <=0.12 SENSITIVE Sensitive     CEFTRIAXONE <=0.25 SENSITIVE Sensitive     CIPROFLOXACIN <=0.25 SENSITIVE Sensitive     GENTAMICIN <=1 SENSITIVE Sensitive     IMIPENEM <=0.25 SENSITIVE Sensitive     NITROFURANTOIN <=16 SENSITIVE Sensitive     TRIMETH/SULFA <=20 SENSITIVE Sensitive     AMPICILLIN/SULBACTAM <=2 SENSITIVE Sensitive     PIP/TAZO <=4 SENSITIVE Sensitive ug/mL    * >=100,000 COLONIES/mL ESCHERICHIA COLI    Patient presented with shortness of breath and fatigue. She reported cough and chest tightness. Denies dysuria and flank pain. Suspect E.coli represents colonization, do not recommend treating given asymptomatic. Spoke with EDP. Will plan on calling patient. If she reports still feeling poorly, treat with Keflex 500 mg daily for 5 days.  ED Provider: [name], MD   Pricilla Riffle, PharmD, BCPS Clinical Pharmacist 06/25/2023 8:32 AM

## 2023-07-14 DIAGNOSIS — H0015 Chalazion left lower eyelid: Secondary | ICD-10-CM | POA: Diagnosis not present

## 2023-07-14 DIAGNOSIS — H353131 Nonexudative age-related macular degeneration, bilateral, early dry stage: Secondary | ICD-10-CM | POA: Diagnosis not present

## 2023-07-14 DIAGNOSIS — Z961 Presence of intraocular lens: Secondary | ICD-10-CM | POA: Diagnosis not present

## 2023-07-14 DIAGNOSIS — H35373 Puckering of macula, bilateral: Secondary | ICD-10-CM | POA: Diagnosis not present

## 2023-07-28 DIAGNOSIS — K219 Gastro-esophageal reflux disease without esophagitis: Secondary | ICD-10-CM | POA: Diagnosis not present

## 2023-07-28 DIAGNOSIS — N1832 Chronic kidney disease, stage 3b: Secondary | ICD-10-CM | POA: Diagnosis not present

## 2023-07-28 DIAGNOSIS — R0981 Nasal congestion: Secondary | ICD-10-CM | POA: Diagnosis not present

## 2023-07-28 DIAGNOSIS — R5383 Other fatigue: Secondary | ICD-10-CM | POA: Diagnosis not present

## 2023-07-28 DIAGNOSIS — Z1152 Encounter for screening for COVID-19: Secondary | ICD-10-CM | POA: Diagnosis not present

## 2023-07-28 DIAGNOSIS — R051 Acute cough: Secondary | ICD-10-CM | POA: Diagnosis not present

## 2023-07-28 DIAGNOSIS — I129 Hypertensive chronic kidney disease with stage 1 through stage 4 chronic kidney disease, or unspecified chronic kidney disease: Secondary | ICD-10-CM | POA: Diagnosis not present

## 2023-07-28 DIAGNOSIS — U071 COVID-19: Secondary | ICD-10-CM | POA: Diagnosis not present

## 2023-09-04 DIAGNOSIS — D0339 Melanoma in situ of other parts of face: Secondary | ICD-10-CM | POA: Diagnosis not present

## 2023-09-04 DIAGNOSIS — Z85828 Personal history of other malignant neoplasm of skin: Secondary | ICD-10-CM | POA: Diagnosis not present

## 2023-09-04 DIAGNOSIS — L821 Other seborrheic keratosis: Secondary | ICD-10-CM | POA: Diagnosis not present

## 2023-09-21 DIAGNOSIS — Z85828 Personal history of other malignant neoplasm of skin: Secondary | ICD-10-CM | POA: Diagnosis not present

## 2023-09-21 DIAGNOSIS — D03112 Melanoma in situ of right lower eyelid, including canthus: Secondary | ICD-10-CM | POA: Diagnosis not present

## 2023-09-21 DIAGNOSIS — D0339 Melanoma in situ of other parts of face: Secondary | ICD-10-CM | POA: Diagnosis not present

## 2023-10-16 DIAGNOSIS — I129 Hypertensive chronic kidney disease with stage 1 through stage 4 chronic kidney disease, or unspecified chronic kidney disease: Secondary | ICD-10-CM | POA: Diagnosis not present

## 2023-10-16 DIAGNOSIS — E785 Hyperlipidemia, unspecified: Secondary | ICD-10-CM | POA: Diagnosis not present

## 2023-10-16 DIAGNOSIS — M5417 Radiculopathy, lumbosacral region: Secondary | ICD-10-CM | POA: Diagnosis not present

## 2023-10-16 DIAGNOSIS — K219 Gastro-esophageal reflux disease without esophagitis: Secondary | ICD-10-CM | POA: Diagnosis not present

## 2023-10-16 DIAGNOSIS — I2510 Atherosclerotic heart disease of native coronary artery without angina pectoris: Secondary | ICD-10-CM | POA: Diagnosis not present

## 2023-10-16 DIAGNOSIS — I872 Venous insufficiency (chronic) (peripheral): Secondary | ICD-10-CM | POA: Diagnosis not present

## 2023-10-16 DIAGNOSIS — R2689 Other abnormalities of gait and mobility: Secondary | ICD-10-CM | POA: Diagnosis not present

## 2023-10-16 DIAGNOSIS — C4330 Malignant melanoma of unspecified part of face: Secondary | ICD-10-CM | POA: Diagnosis not present

## 2023-10-16 DIAGNOSIS — N1832 Chronic kidney disease, stage 3b: Secondary | ICD-10-CM | POA: Diagnosis not present

## 2024-01-17 DIAGNOSIS — M79605 Pain in left leg: Secondary | ICD-10-CM | POA: Diagnosis not present

## 2024-01-17 DIAGNOSIS — R2689 Other abnormalities of gait and mobility: Secondary | ICD-10-CM | POA: Diagnosis not present

## 2024-01-17 DIAGNOSIS — R1032 Left lower quadrant pain: Secondary | ICD-10-CM | POA: Diagnosis not present

## 2024-01-17 DIAGNOSIS — M5417 Radiculopathy, lumbosacral region: Secondary | ICD-10-CM | POA: Diagnosis not present

## 2024-04-01 DIAGNOSIS — Z85820 Personal history of malignant melanoma of skin: Secondary | ICD-10-CM | POA: Diagnosis not present

## 2024-04-01 DIAGNOSIS — Z85828 Personal history of other malignant neoplasm of skin: Secondary | ICD-10-CM | POA: Diagnosis not present

## 2024-04-01 DIAGNOSIS — L570 Actinic keratosis: Secondary | ICD-10-CM | POA: Diagnosis not present

## 2024-04-01 DIAGNOSIS — L720 Epidermal cyst: Secondary | ICD-10-CM | POA: Diagnosis not present

## 2024-04-01 DIAGNOSIS — B353 Tinea pedis: Secondary | ICD-10-CM | POA: Diagnosis not present

## 2024-04-01 DIAGNOSIS — L814 Other melanin hyperpigmentation: Secondary | ICD-10-CM | POA: Diagnosis not present

## 2024-04-01 DIAGNOSIS — B351 Tinea unguium: Secondary | ICD-10-CM | POA: Diagnosis not present

## 2024-04-01 DIAGNOSIS — L2989 Other pruritus: Secondary | ICD-10-CM | POA: Diagnosis not present

## 2024-04-01 DIAGNOSIS — L821 Other seborrheic keratosis: Secondary | ICD-10-CM | POA: Diagnosis not present

## 2024-04-15 DIAGNOSIS — N1832 Chronic kidney disease, stage 3b: Secondary | ICD-10-CM | POA: Diagnosis not present

## 2024-04-15 DIAGNOSIS — E785 Hyperlipidemia, unspecified: Secondary | ICD-10-CM | POA: Diagnosis not present

## 2024-04-15 DIAGNOSIS — Z1389 Encounter for screening for other disorder: Secondary | ICD-10-CM | POA: Diagnosis not present

## 2024-04-15 DIAGNOSIS — I129 Hypertensive chronic kidney disease with stage 1 through stage 4 chronic kidney disease, or unspecified chronic kidney disease: Secondary | ICD-10-CM | POA: Diagnosis not present

## 2024-04-15 DIAGNOSIS — Z0189 Encounter for other specified special examinations: Secondary | ICD-10-CM | POA: Diagnosis not present

## 2024-04-15 DIAGNOSIS — M8580 Other specified disorders of bone density and structure, unspecified site: Secondary | ICD-10-CM | POA: Diagnosis not present

## 2024-04-22 DIAGNOSIS — E785 Hyperlipidemia, unspecified: Secondary | ICD-10-CM | POA: Diagnosis not present

## 2024-04-22 DIAGNOSIS — Z23 Encounter for immunization: Secondary | ICD-10-CM | POA: Diagnosis not present

## 2024-04-22 DIAGNOSIS — M1990 Unspecified osteoarthritis, unspecified site: Secondary | ICD-10-CM | POA: Diagnosis not present

## 2024-04-22 DIAGNOSIS — Z7189 Other specified counseling: Secondary | ICD-10-CM | POA: Diagnosis not present

## 2024-04-22 DIAGNOSIS — Z1331 Encounter for screening for depression: Secondary | ICD-10-CM | POA: Diagnosis not present

## 2024-04-22 DIAGNOSIS — Z1339 Encounter for screening examination for other mental health and behavioral disorders: Secondary | ICD-10-CM | POA: Diagnosis not present

## 2024-04-22 DIAGNOSIS — R82998 Other abnormal findings in urine: Secondary | ICD-10-CM | POA: Diagnosis not present

## 2024-04-22 DIAGNOSIS — M5416 Radiculopathy, lumbar region: Secondary | ICD-10-CM | POA: Diagnosis not present
